# Patient Record
Sex: Male | Born: 1945 | Race: White | Hispanic: No | Marital: Single | Smoking: Never smoker
Health system: Southern US, Community
[De-identification: ages and names within clinical notes are randomized; demographics above are authoritative.]

## PROBLEM LIST (undated history)

## (undated) DIAGNOSIS — I10 Essential (primary) hypertension: Secondary | ICD-10-CM

## (undated) DIAGNOSIS — I2510 Atherosclerotic heart disease of native coronary artery without angina pectoris: Secondary | ICD-10-CM

## (undated) DIAGNOSIS — E785 Hyperlipidemia, unspecified: Secondary | ICD-10-CM

## (undated) DIAGNOSIS — R55 Syncope and collapse: Secondary | ICD-10-CM

## (undated) DIAGNOSIS — D0339 Melanoma in situ of other parts of face: Secondary | ICD-10-CM

## (undated) DIAGNOSIS — E039 Hypothyroidism, unspecified: Secondary | ICD-10-CM

## (undated) HISTORY — DX: Atherosclerotic heart disease of native coronary artery without angina pectoris: I25.10

## (undated) HISTORY — PX: MITRAL VALVE REPAIR: SHX2039

## (undated) HISTORY — DX: Melanoma in situ of other parts of face: D03.39

## (undated) HISTORY — DX: Essential (primary) hypertension: I10

## (undated) HISTORY — PX: SKIN CANCER EXCISION: SHX779

## (undated) HISTORY — PX: LAPAROSCOPIC APPENDECTOMY: SUR753

## (undated) HISTORY — DX: Syncope and collapse: R55

## (undated) HISTORY — PX: HERNIA REPAIR: SHX51

---

## 1994-12-17 HISTORY — PX: CORONARY ARTERY BYPASS GRAFT: SHX141

## 1998-06-24 ENCOUNTER — Inpatient Hospital Stay (HOSPITAL_COMMUNITY): Admission: EM | Admit: 1998-06-24 | Discharge: 1998-07-02 | Payer: Self-pay | Admitting: Emergency Medicine

## 1999-03-12 ENCOUNTER — Emergency Department (HOSPITAL_COMMUNITY): Admission: EM | Admit: 1999-03-12 | Discharge: 1999-03-12 | Payer: Self-pay | Admitting: Emergency Medicine

## 1999-07-12 ENCOUNTER — Ambulatory Visit (HOSPITAL_BASED_OUTPATIENT_CLINIC_OR_DEPARTMENT_OTHER): Admission: RE | Admit: 1999-07-12 | Discharge: 1999-07-12 | Payer: Self-pay | Admitting: Orthopedic Surgery

## 2003-01-05 ENCOUNTER — Encounter: Payer: Self-pay | Admitting: *Deleted

## 2003-01-05 ENCOUNTER — Encounter (HOSPITAL_COMMUNITY): Admission: RE | Admit: 2003-01-05 | Discharge: 2003-02-04 | Payer: Self-pay | Admitting: *Deleted

## 2005-02-08 ENCOUNTER — Ambulatory Visit: Payer: Self-pay | Admitting: Cardiology

## 2005-02-27 ENCOUNTER — Ambulatory Visit: Payer: Self-pay

## 2005-03-02 ENCOUNTER — Ambulatory Visit (HOSPITAL_COMMUNITY): Admission: RE | Admit: 2005-03-02 | Discharge: 2005-03-02 | Payer: Self-pay | Admitting: Gastroenterology

## 2005-08-17 DIAGNOSIS — D0339 Melanoma in situ of other parts of face: Secondary | ICD-10-CM

## 2005-08-17 HISTORY — DX: Melanoma in situ of other parts of face: D03.39

## 2006-03-28 ENCOUNTER — Ambulatory Visit: Payer: Self-pay | Admitting: Cardiology

## 2007-04-01 ENCOUNTER — Ambulatory Visit: Payer: Self-pay | Admitting: Cardiology

## 2007-04-10 ENCOUNTER — Ambulatory Visit: Payer: Self-pay

## 2007-04-10 LAB — CONVERTED CEMR LAB
ALT: 21 units/L (ref 0–40)
AST: 24 units/L (ref 0–37)
Albumin: 3.9 g/dL (ref 3.5–5.2)
Alkaline Phosphatase: 53 units/L (ref 39–117)
Bilirubin, Direct: 0.1 mg/dL (ref 0.0–0.3)
Cholesterol: 155 mg/dL (ref 0–200)
Direct LDL: 91.3 mg/dL
HDL: 42.6 mg/dL (ref >39.0)
LDL Cholesterol: 90 mg/dL (ref 0–99)
Total Bilirubin: 0.8 mg/dL (ref 0.3–1.2)
Total CHOL/HDL Ratio: 3.6
Total Protein: 6.8 g/dL (ref 6.0–8.3)
Triglycerides: 113 mg/dL (ref 0–149)
VLDL: 23 mg/dL (ref 0–40)

## 2007-05-02 ENCOUNTER — Ambulatory Visit: Payer: Self-pay | Admitting: Cardiology

## 2007-05-02 LAB — CONVERTED CEMR LAB
ALT: 22 units/L (ref 0–40)
AST: 25 units/L (ref 0–37)
Albumin: 3.7 g/dL (ref 3.5–5.2)
Alkaline Phosphatase: 52 units/L (ref 39–117)
Bilirubin, Direct: 0.1 mg/dL (ref 0.0–0.3)
Cholesterol: 148 mg/dL (ref 0–200)
HDL: 41 mg/dL (ref >39.0)
LDL Cholesterol: 89 mg/dL (ref 0–99)
Total Bilirubin: 0.8 mg/dL (ref 0.3–1.2)
Total CHOL/HDL Ratio: 3.6
Total Protein: 6.5 g/dL (ref 6.0–8.3)
Triglycerides: 89 mg/dL (ref 0–149)
VLDL: 18 mg/dL (ref 0–40)

## 2008-05-21 ENCOUNTER — Ambulatory Visit: Payer: Self-pay | Admitting: Cardiology

## 2008-05-31 ENCOUNTER — Ambulatory Visit: Payer: Self-pay

## 2008-11-24 ENCOUNTER — Ambulatory Visit: Payer: Self-pay | Admitting: Cardiology

## 2009-04-04 DIAGNOSIS — C4491 Basal cell carcinoma of skin, unspecified: Secondary | ICD-10-CM

## 2009-04-04 HISTORY — DX: Basal cell carcinoma of skin, unspecified: C44.91

## 2009-04-28 ENCOUNTER — Encounter: Payer: Self-pay | Admitting: Cardiology

## 2009-07-27 DIAGNOSIS — R55 Syncope and collapse: Secondary | ICD-10-CM | POA: Insufficient documentation

## 2009-07-27 DIAGNOSIS — I2510 Atherosclerotic heart disease of native coronary artery without angina pectoris: Secondary | ICD-10-CM | POA: Insufficient documentation

## 2009-07-28 ENCOUNTER — Ambulatory Visit: Payer: Self-pay | Admitting: Cardiology

## 2010-06-20 ENCOUNTER — Inpatient Hospital Stay (HOSPITAL_COMMUNITY): Admission: EM | Admit: 2010-06-20 | Discharge: 2010-06-21 | Payer: Self-pay | Admitting: Emergency Medicine

## 2010-06-20 ENCOUNTER — Encounter (INDEPENDENT_AMBULATORY_CARE_PROVIDER_SITE_OTHER): Payer: Self-pay | Admitting: Surgery

## 2010-08-07 ENCOUNTER — Ambulatory Visit: Payer: Self-pay | Admitting: Cardiology

## 2010-08-07 DIAGNOSIS — I4510 Unspecified right bundle-branch block: Secondary | ICD-10-CM | POA: Insufficient documentation

## 2010-08-07 DIAGNOSIS — I2510 Atherosclerotic heart disease of native coronary artery without angina pectoris: Secondary | ICD-10-CM | POA: Insufficient documentation

## 2010-08-07 DIAGNOSIS — E663 Overweight: Secondary | ICD-10-CM | POA: Insufficient documentation

## 2010-08-07 DIAGNOSIS — R0602 Shortness of breath: Secondary | ICD-10-CM | POA: Insufficient documentation

## 2011-01-18 NOTE — Letter (Signed)
Summary: [place] [facility] (Nuc Med Stress)  [facility]  [address] [place] [address]   Phone: [phone]  Fax: [phone]    Nuclear Medicine 1-Day Stress Test Information Sheet  Re:     TYLOR COURTWRIGHT   DOB:     [birth year] MRN:     [id number] Weight:  Appointment Date: Register at: Appointment Time: Referring MD:  _X__Exercise Stress  __Adenosine   __Dobutamine  __Lexiscan  __Persantine   __Thallium  Urgency: ____1 (next day)   ____2 (one week)    ____3 (PRN)  Patient will receive Follow Up call with results: Patient needs follow-up appointment:  Instructions regarding medication:  How to prepare for your stress test: 1. DO NOT eat or dring 6 hours prior to your arrival time. This includes no caffeine (coffee, tea, sodas, chocolate) if you were instructed to take your medications, drink water with it. 2. DO NOT use any tobacco products for at leaset 8 hours prior to arrival. 3. DO NOT wear dresses or any clothing that may have metal clasps or buttons. 4. Wear short sleeve shirts, loose clothing, and comfortalbe walking shoes. 5. DO NOT use lotions, oils or powder on your chest before the test. 6. The test will take approximately 3-4 hours from the time you arrive until completion. 7. To register the day of the test, go to the Short Stay entrance at [facility]. 8. If you must cancel your test, call [phone] as soon as you are aware. 9. PLEASE DO NOT TAKE TOPROL (METOPROLOL) THE MORNING OF TEST. After you arrive for test:   When you arrive at [facility], you will go to Short Stay to be registered. They will then send you to Radiology to check in. The Nuclear Medicine Tech will get you and start an IV in your arm or hand. A small amount of a radioactive tracer will then be injected into your IV. This tracer will then have to circulate for 30-45 minutes. During this time you will wait in the waiting room and you will be able to drink  something without caffeine. A series of pictures will be taken of your heart follwoing this waiting period. After the 1st set of pictures you will go to the stress lab to get ready for your stress test. During the stress test, another small amount of a radioactive tracer will be injected through your IV. When the stress test is complete, there is a short rest period while your heart rate and blood pressure will be monitored. When this monitoring period is complete you will have another set of pictrues taken. (The same as the 1st set of pictures). These pictures are taken between 15 minutes and 1 hour after the stress test. The time depends on the type of stress test you had. Your doctor will inform you of your test results within 7 days after test.    The possibilities of certain changes are possible during the test. They include abnormal blood pressure and disorders of the heart. Side effects of persantine or adenosine can include flushing, chest pain, shortness of breath, stomach tightness, headache and light-headedness. These side effects usually do not last long and are self-resolving. Every effort will be made to keep you comfortable and to minimize complications by obtaining a medical history and by close observation during the test. Emergency equipment, medications, and trained personnel are available to deal with any unusual situation which may arise.  Please notify office at least  48 hours in advance if you are unable to keep this appt.

## 2011-01-18 NOTE — Assessment & Plan Note (Signed)
Summary: Matthew Williams   Visit Type:  Follow-up Primary Provider:  Christene Slates  CC:  no cardiology complaints.  History of Present Illness: Matthew Williams returns today for evaluation and management his coronary disease.  He's having no angina but does have dyspnea on exertion. His weight is up about 15 pounds.  His last stress Myoview was in June of 2009. EF is 67% with no ischemia.  Since his last visit, he had had an emergency appendectomy. There were no complications. I saw him at that time.  I reviewed his labs today from June. Unfortunately, with his weight gain, his numbers look worse. All maximum Crestor his LDL is 99.   Current Medications (verified): 1)  Diovan 80 Mg Tabs (Valsartan) 2)  Synthroid 50 Mcg Tabs (Levothyroxine Sodium) .... Take 1 Tab Daily 3)  Zyrtec Allergy 10 Mg Tabs (Cetirizine Hcl) .... Take 1 Tab Daily 4)  Toprol Xl 50 Mg Xr24h-Tab (Metoprolol Succinate) .... Take 1 Tab Daily 5)  Aspirin 325 Mg Tabs (Aspirin) .... Take 1 Tab Daily 6)  Alprazolam 0.5 Mg Tabs (Alprazolam) .... Take As Needed 7)  Crestor 40 Mg Tabs (Rosuvastatin Calcium) .... Take 1 Tab Daily 8)  Magnesium Oxide 400 Mg Tabs (Magnesium Oxide) .... Take 1 Tab Daily 9)  Nitrostat 0.4 Mg Subl (Nitroglycerin) .... Take As Directed  Allergies (verified): No Known Drug Allergies  Past History:  Past Medical History: Last updated: 07/27/2009 Current Problems:  SYNCOPE (ICD-780.2) CAD (ICD-414.00)  Past Surgical History: Last updated: 07/27/2009 cabg 1996 mitral valve replacement  Family History: Last updated: 07/27/2009 Father:unknown Mother:unknown  Social History: Last updated: 07/27/2009 Tobacco Use - No.  Alcohol Use - no Regular Exercise - no Drug Use - no  Risk Factors: Exercise: no (07/27/2009)  Risk Factors: Smoking Status: never (07/27/2009)  Review of Systems       negative other than history of present illness  Vital Signs:  Patient profile:   65 year old  male Weight:      212 pounds BMI:     30.53 Pulse rate:   60 / minute BP sitting:   116 / 75  (right arm)  Vitals Entered By: Dreama Saa, CNA (August 07, 2010 9:19 AM)  Physical Exam  General:  no acute distress, he is clearly gained weight Head:  normocephalic and atraumatic Eyes:  PERRLA/EOM intact; conjunctiva and lids normal. Neck:  Neck supple, no JVD. No masses, thyromegaly or abnormal cervical nodes. Chest Wall:  no deformities or breast masses noted Lungs:  Clear bilaterally to auscultation and percussion. Heart:  PMI nondisplaced, normal S1-S2, regular rate and rhythm, carotids normal without bruits Abdomen:  Bowel sounds positive; abdomen soft and non-tender without masses, organomegaly, or hernias noted. No hepatosplenomegaly. Msk:  Back normal, normal gait. Muscle strength and tone normal. Pulses:  pulses normal in all 4 extremities Extremities:  No clubbing or cyanosis. Neurologic:  Alert and oriented x 3. Skin:  Intact without lesions or rashes. Psych:  Normal affect.   Problems:  Medical Problems Added: 1)  Dx of Rbbb  (ICD-426.4) 2)  Dx of Overweight/obesity  (ICD-278.02) 3)  Dx of Coronary Atherosclerosis of Artery Bypass Graft  (ICD-414.04) 4)  Dx of Dyspnea  (ICD-786.05)  EKG  Procedure date:  08/07/2010  Findings:      normal sinus rhythm, right bundle branch block, no change  Impression & Recommendations:  Problem # 1:  CORONARY ATHEROSCLEROSIS OF ARTERY BYPASS GRAFT (ICD-414.04) His dyspnea may be related to weight gain but  I'm concerned about an ischemic equivalent. I'll arrange an exercise stress Myoview off of Toprol.  His updated medication list for this problem includes:    Toprol Xl 50 Mg Xr24h-tab (Metoprolol succinate) .Marland Kitchen... Take 1 tab daily    Aspirin 325 Mg Tabs (Aspirin) .Marland Kitchen... Take 1 tab daily    Nitrostat 0.4 Mg Subl (Nitroglycerin) .Marland Kitchen... Take as directed  Problem # 2:  DYSPNEA (ICD-786.05) Assessment: Deteriorated  His  updated medication list for this problem includes:    Diovan 80 Mg Tabs (Valsartan)    Toprol Xl 50 Mg Xr24h-tab (Metoprolol succinate) .Marland Kitchen... Take 1 tab daily    Aspirin 325 Mg Tabs (Aspirin) .Marland Kitchen... Take 1 tab daily  Orders: Nuclear Stress Test (Nuc Stress Test)  Problem # 3:  OVERWEIGHT/OBESITY (ICD-278.02) His weight is and issue I advised him to lose 15 pounds.  Problem # 4:  RBBB (ICD-426.4) Assessment: Unchanged  His updated medication list for this problem includes:    Toprol Xl 50 Mg Xr24h-tab (Metoprolol succinate) .Marland Kitchen... Take 1 tab daily    Aspirin 325 Mg Tabs (Aspirin) .Marland Kitchen... Take 1 tab daily    Nitrostat 0.4 Mg Subl (Nitroglycerin) .Marland Kitchen... Take as directed  Patient Instructions: 1)  Your physician recommends that you schedule a follow-up appointment in: 12 months 2)  Your physician recommends that you continue on your current medications as directed. Please refer to the Current Medication list given to you today. 3)  Your physician has requested that you have an exercise stress myoview.  For further information please visit [email or web address]  Please follow instruction sheet, as given.

## 2011-03-04 LAB — COMPREHENSIVE METABOLIC PANEL
AST: 25 U/L (ref 0–37)
BUN: 11 mg/dL (ref 6–23)
CO2: 28 mEq/L (ref 19–32)
Calcium: 8.9 mg/dL (ref 8.4–10.5)
Chloride: 104 mEq/L (ref 96–112)
Creatinine, Ser: 1.03 mg/dL (ref 0.4–1.5)
GFR calc Af Amer: >60 mL/min (ref >60)
GFR calc non Af Amer: >60 mL/min (ref >60)
Glucose, Bld: 104 mg/dL — ABNORMAL HIGH (ref 70–99)
Total Bilirubin: 0.9 mg/dL (ref 0.3–1.2)

## 2011-03-04 LAB — DIFFERENTIAL
Basophils Absolute: 0 10*3/uL (ref 0.0–0.1)
Eosinophils Relative: 0 % (ref 0–5)
Lymphocytes Relative: 12 % (ref 12–46)
Neutrophils Relative %: 79 % — ABNORMAL HIGH (ref 43–77)

## 2011-03-04 LAB — URINALYSIS, ROUTINE W REFLEX MICROSCOPIC
Bilirubin Urine: NEGATIVE
Glucose, UA: NEGATIVE mg/dL
Ketones, ur: NEGATIVE mg/dL
Leukocytes, UA: NEGATIVE
Nitrite: NEGATIVE
Protein, ur: NEGATIVE mg/dL
Specific Gravity, Urine: 1.019 (ref 1.005–1.030)
Urobilinogen, UA: 0.2 mg/dL (ref 0.0–1.0)
pH: 6 (ref 5.0–8.0)

## 2011-03-04 LAB — CBC
HCT: 46.3 % (ref 39.0–52.0)
Hemoglobin: 15.6 g/dL (ref 13.0–17.0)
MCH: 30.4 pg (ref 26.0–34.0)
MCHC: 33.8 g/dL (ref 30.0–36.0)
MCV: 90.1 fL (ref 78.0–100.0)
RBC: 5.14 MIL/uL (ref 4.22–5.81)

## 2011-03-04 LAB — POCT CARDIAC MARKERS: CKMB, poc: 2 ng/mL (ref 1.0–8.0)

## 2011-03-04 LAB — LIPASE, BLOOD: Lipase: 25 U/L (ref 11–59)

## 2011-03-04 LAB — URINE MICROSCOPIC-ADD ON

## 2011-04-23 ENCOUNTER — Other Ambulatory Visit: Payer: Self-pay | Admitting: Family Medicine

## 2011-05-01 NOTE — Assessment & Plan Note (Signed)
[facility]                            CARDIOLOGY OFFICE NOTE   NAME:Lukas, INAKI VANTINE                    MRN:          [id number]  DATE:05/21/2008                            DOB:          1946-08-24    [name] comes in today for followup.  He has been having increased dyspnea  on exertion.  He also has excess fatigue.   PAST MEDICAL HISTORY:  1. He has known coronary disease, and his coronary bypass grafting was      done in 1996.  His last cath was in 1999 which showed patent      grafts.  He had a stress Myoview on April 10, 2007, which showed      minimal scarring of the inferior wall, EF of 61%, and no      significant ischemia.  2. Hypertension.  3. Hyperlipidemia.  His lipids were recently checked by Dr. Doristine Counter      which I have reviewed today.  He is at goal.  4. He also has a history of syncope with a negative EP study in the      past.   He denies any orthopnea, PND or peripheral edema.  He has had no angina  per se.   CURRENT MEDICATIONS:  He is currently on:  1  Diovan 80 mg a day.  1. Synthroid 50 mcg daily.  2. Zyrtec 10 mg a day.  3. Toprol XL 50 mg a day.  4. Aspirin 325 mg a day.  5. Alprazolam 0.5 mg a day.  6. Crestor 40 mg a day.  7. Magnesium oxide 400 mg a day.   PHYSICAL EXAMINATION:  VITAL SIGNS:  Blood pressure 125/74, and his  pulse is 72 and regular.  His weight is 207, up 8.  HEENT:  Unchanged.  Carotid upstrokes were equal bilateral without  bruits.  No JVD.  Thyroid is not enlarged.  Trachea is midline.  LUNGS:  Clear.  HEART:  Reveals a nondisplaced PMI.  Normal S1, S2.  ABDOMEN:  Soft, good bowel sounds.  No midline bruit.  No hepatomegaly.  EXTREMITIES:  No cyanosis, clubbing or edema.  Pulses are intact.  NEURO:  Intact.   EKG shows sinus rhythm with a right bundle which is stable.   I am concerned about Mike's dyspnea on exertion.  He is also having a  lot of fatigue.  His labs were recently normal  including a CBC and  thyroid.   PLAN:  Exercise rest stress Myoview off of Toprol.  His grafts are 65  years old, and we need to rule out obstructive disease. If this is  negative, I will see him back in 6 months.     Thomas C. Daleen Squibb, MD, [facility]  Electronically Signed    TCW/MedQ  DD: 05/21/2008  DT: 05/21/2008  Job #: [id number]   cc:   Marjory Lies, M.D.

## 2011-05-01 NOTE — Assessment & Plan Note (Signed)
[facility]                            CARDIOLOGY OFFICE NOTE   NAME:Arterburn, OSAMAH SCHMADER                    MRN:          [id number]  DATE:11/24/2008                            DOB:          04-Oct-1946    [name] comes in today for followup.  Other than gaining some weight, he  has no complaints.   He just got back from [place], quill hunting.  He is going to [place] this  winter.  He was bracing low temperatures and high winds and had no chest  pain or angina.  I think it is an adequate stress test.   He had lipids back in the summer and was at goal.   His last stress Myoview was on May 31, 2008.  It showed excellent  exercise tolerance, EF 67% with no significant ischemia.   His meds include:  1. Diovan 80 mg per day.  2. Synthroid 50 mcg a day.  3. Zyrtec 10 mg a day.  4. Toprol-XL 50 mg per day.  5. Aspirin 325 mg a day.  6. Alprazolam 0.5 mg per day.  7. Crestor 40 mg a day.  8. Magnesium 400 mg a day.  9. Carries sublingual nitroglycerin.   PHYSICAL EXAMINATION:  VITAL SIGNS:  His blood pressure is 128/76, pulse  72 and regular.  His weight is up to 212.  HEENT:  Normal.  NECK:  Supple.  Carotids are equal bilaterally without bruits.  No JVD.  Thyroid is not enlarged.  Trachea is midline.  LUNGS:  Clear to auscultation and percussion.  HEART:  Soft S1 and S2.  PMI is hard to appreciate.  ABDOMEN:  Soft.  Good bowel sounds.  No midline bruit.  No pulsatile  mass.  EXTREMITIES:  There were no cyanosis or clubbing.  Trace edema.  Pulses  are intact.   ASSESSMENT AND PLAN:  [name] is doing well except he needs to drop his  weight.  He has had a little bit of residual edema at the end of a long  day.  I think this will get better as he drops  his weight and increases his activity.  We will see him back in 6  months.  I have made no changes in his medical program.     Jesse Sans. Daleen Squibb, MD, [facility]  Electronically Signed    TCW/MedQ  DD:  11/24/2008  DT: 11/24/2008  Job #: [id number]   cc:   Marjory Lies, M.D.

## 2011-05-04 NOTE — Procedures (Signed)
   NAME:  Matthew Williams, Matthew Williams NO.:  [id number]   MEDICAL RECORD NO.:  [id number]                  PATIENT TYPE:   LOCATION:                                       FACILITY:   PHYSICIAN:  Vida Roller, M.D.                DATE OF BIRTH:   DATE OF PROCEDURE:  01/05/2003  DATE OF DISCHARGE:                                  ECHOCARDIOGRAM   NUCLEAR PERFUSION IMAGING STUDY:   REFERRING PHYSICIANS:  [place] Bing, M.D. [facility] and Jesse Sans. Wall, M.D.  [facility]   INDICATIONS FOR PROCEDURE:  This is a patient with coronary artery disease,  status post coronary artery bypass graft with patent grafts and cardiac  catheterization in 1999 now with angina.   DESCRIPTION OF PROCEDURE:  Stress used was exercise.  The patient exercised  for approximately 7 minutes and 30 seconds of a Bruce protocol attaining 10  METS of exercise.  His baseline heart rate was 67, maximum achieved heart  rate was 152 which is 93% of his maximum-predicted heart rate for age.  His  maximum systolic blood pressure was 220, giving him a double product of  2700.  He sustained mild shortness of breath which resolved in recovery.  He  had only a few PACs with no obvious ischemic changes on electrocardiogram;  and the reason for terminating the exam was fatigue  His baseline  electrocardiogram is interpretable with only occasional PACs and shows no  ischemic changes.   The patient was imaged in the Spec mode.  The reconstruction was in the  short axis, horizontal long axis, and vertical long axis.  The patient was  imaged with technetium Cardiolite 10 millicuries at rest and 30 millicuries  for stress (he weighs 190 pounds and he is 6 feet tall).  Interpretation of  the images revealed only minimal motion artifact on the raw images.  Spec  imaging shows minimal apical thinning not consistent with either ischemia,  may indeed be an old infarction.  His gaited images show an ejection  fraction of  63% with no regional wall abnormalities.   IMPRESSION:  This is a diagnostic exercise perfusion imaging with only  minimal evidence of a myocardial infarction in the distal left anterior  descending distribution, but no active ischemia with preserved left  ventricular ejection fraction.                                               Vida Roller, M.D.    JH/MEDQ  D:  01/05/2003  T:  01/06/2003  Job:  [id number]

## 2011-05-04 NOTE — Procedures (Signed)
   NAME:  Matthew Williams, Matthew Williams NO.:  [id number]   MEDICAL RECORD NO.:  [id number]                    PATIENT TYPE:   LOCATION:                                       FACILITY:   PHYSICIAN:  Vida Roller, M.D.                DATE OF BIRTH:  [birth year]   DATE OF PROCEDURE:  DATE OF DISCHARGE:                                    STRESS TEST   EXERCISE CARDIOLITE:   INDICATION:  The patient has a history of coronary artery disease status  post coronary artery bypass grafting with patent grafts by catheterization  in July 1999.  He has had no recurrent anginal or ischemic symptoms.   BASELINE DATA:  EKG normal sinus rhythm at 67 beats per minute with  nonspecific ST changes.  Baseline blood pressure 120/80.   The patient exercised for 7 minutes and 30 seconds to 10.1 METS in a Bruce  protocol stage III.  Maximum heart rate achieved was 152 beats per minute.  Maximum blood pressure achieved was 220/98.  EKG showed a few PACs with no  ischemic changes noted.  The patient complained of shortness of breath which  resolved in recovery.   Final images and results are pending MD review.     Amy Mercy Riding, P.A. [facility]                     Vida Roller, M.D.    AB/MEDQ  D:  01/05/2003  T:  01/06/2003  Job:  [id number]

## 2011-05-04 NOTE — Assessment & Plan Note (Signed)
[facility]                            CARDIOLOGY OFFICE NOTE   NAME:Matthew Williams, Matthew Williams                    MRN:          [id number]  DATE:04/01/2007                            DOB:          08-Jul-1946    Matthew Williams comes in today for further management of his coronary artery  disease.  He is status post coronary artery bypass grafting in 1999.  He  is due a stress Myoview.  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  He has had no angina.  He also has a history  of hypertension, hyperlipidemia, and a history of unexplained syncope  with a negative EP study in the past.   Because of the Vytorin controversy, Dr. Doristine Counter switched him over to  Crestor 20 mg a day.  His lipids on Vytorin 10/40 were at goal.   He denies any problems at the present time.   MEDICATIONS:  1. Diovan 80 mg a day.  2. Synthroid 60 mcg a day.  3. Zyrtec 10 mg a day.  4. Toprol XL 50 mg a day.  5. Aspirin 325 a day.  6. Crestor 20 mg a day.  7. Alprazolam 0.5 mg daily.  8. Lodrane 24 ER daily.   PHYSICAL EXAM:  Blood pressure is 120/79, pulse was 68 and regular.  He  has normal sinus rhythm with a right bundle, which is stable.  Weight is  199.  He is in no acute distress.  HEENT:  Normocephalic, atraumatic.  PERRLA.  Extraocular muscles are  intact.  Sclerae clear.  Facial symmetry is normal.  Dentition is  satisfactory.  Carotids are full.  There are no bruits.  Thyroid is not enlarged.  Trachea is midline.  There is no JVD.  LUNGS:  Clear.  HEART:  Reveals a nondisplaced PMI.  There is a normal S1, S2.  Widely  split S2.  ABDOMEN:  Soft with good bowel sounds.  No midline bruit.  No  hepatomegaly.  EXTREMITIES:  With no cyanosis, clubbing, or edema.  Pulses are brisk.  NEURO:  Exam is intact.   ASSESSMENT AND PLAN:  Matthew Williams is doing well.  He is due a stress Myoview,  which we will arrange off of Toprol.  In addition, he would like to  follow up  his blood pressure on Crestor,  which he has not done yet.  I will send a copy to Dr. Doristine Counter.  I will  plan on seeing him back, if his stress test is unremarkable, in a year.     Thomas C. Daleen Squibb, MD, [facility]  Electronically Signed    TCW/MedQ  DD: 04/01/2007  DT: 04/01/2007  Job #: [id number]   cc:   Marjory Lies, M.D.

## 2011-06-28 ENCOUNTER — Telehealth: Payer: Self-pay | Admitting: Cardiology

## 2011-06-28 MED ORDER — METOPROLOL SUCCINATE ER 50 MG PO TB24: 50.0000 mg | ORAL_TABLET | Freq: Every day | ORAL | Status: DC

## 2011-06-28 NOTE — Telephone Encounter (Signed)
Patient needs refill on Toprol 50mg  QD sent to [facility] / tg

## 2011-07-23 ENCOUNTER — Encounter: Payer: Self-pay | Admitting: Cardiology

## 2011-08-06 ENCOUNTER — Encounter: Payer: Self-pay | Admitting: Cardiology

## 2011-08-06 ENCOUNTER — Ambulatory Visit (INDEPENDENT_AMBULATORY_CARE_PROVIDER_SITE_OTHER): Payer: Medicare Other | Admitting: Cardiology

## 2011-08-06 DIAGNOSIS — R55 Syncope and collapse: Secondary | ICD-10-CM

## 2011-08-06 DIAGNOSIS — I2510 Atherosclerotic heart disease of native coronary artery without angina pectoris: Secondary | ICD-10-CM

## 2011-08-06 DIAGNOSIS — I4510 Unspecified right bundle-branch block: Secondary | ICD-10-CM

## 2011-08-06 DIAGNOSIS — E782 Mixed hyperlipidemia: Secondary | ICD-10-CM

## 2011-08-06 DIAGNOSIS — I25810 Atherosclerosis of coronary artery bypass graft(s) without angina pectoris: Secondary | ICD-10-CM

## 2011-08-06 DIAGNOSIS — R0602 Shortness of breath: Secondary | ICD-10-CM

## 2011-08-06 NOTE — Patient Instructions (Signed)
Your physician recommends that you schedule a follow-up appointment in: 1 year  

## 2011-08-06 NOTE — Assessment & Plan Note (Signed)
Stable. No change in medical therapy. 

## 2011-08-06 NOTE — Progress Notes (Signed)
HPI Matthew Williams comes in today for follow up of this coronary disease and previous current bypass grafting.  He continues to walk on a regular basis and is having no angina. His weight is up and he is having trouble getting this off.  He's compliant with his medications.  Laboratory data from his primary care review. His total cholesterol was 144, triglycerides 131, and a she'll 39, LDL 78 with normal LFTs. Fasting blood sugar was 91, potassium was 5.6, creatinine 1.0.  His EKG shows normal sinus rhythm with right bundle. This is stable. Past Medical History  Diagnosis Date  . Syncope   . CAD (coronary artery disease)     Past Surgical History  Procedure Date  . Coronary artery bypass graft 1996  . Mitral valve replacement     No family history on file.  History   Social History  . Marital Status: Single    Spouse Name: N/A    Number of Children: N/A  . Years of Education: N/A   Occupational History  . Not on file.   Social History Main Topics  . Smoking status: Unknown If Ever Smoked  . Smokeless tobacco: Not on file  . Alcohol Use: No  . Drug Use: No  . Sexually Active: Not on file   Other Topics Concern  . Not on file   Social History Narrative   Does not get regular exercise    No Known Allergies  Current Outpatient Prescriptions  Medication Sig Dispense Refill  . ALPRAZolam (XANAX) 0.5 MG tablet Take 0.5 mg by mouth as needed.        Marland Kitchen aspirin 325 MG tablet Take 325 mg by mouth daily.        . cetirizine (ZYRTEC) 10 MG tablet Take 10 mg by mouth daily.        Marland Kitchen levothyroxine (SYNTHROID, LEVOTHROID) 50 MCG tablet Take 50 mcg by mouth daily.        . magnesium oxide (MAG-OX) 400 MG tablet Take 400 mg by mouth daily.        . metoprolol (TOPROL-XL) 50 MG 24 hr tablet Take 1 tablet (50 mg total) by mouth daily.  30 tablet  1  . nitroGLYCERIN (NITROSTAT) 0.4 MG SL tablet Place 0.4 mg under the tongue as directed.        . rosuvastatin (CRESTOR) 40 MG tablet Take 40  mg by mouth daily.        . valsartan (DIOVAN) 80 MG tablet Take 80 mg by mouth.          ROS Negative other than HPI.   PE General Appearance: well developed, well nourished in no acute distress, overweight HEENT: symmetrical face, PERRLA, good dentition  Neck: no JVD, thyromegaly, or adenopathy, trachea midline Chest: symmetric without deformity Cardiac: PMI non-displaced, RRR, normal S1, S2, no gallop or murmur Lung: clear to ausculation and percussion Vascular: all pulses full without bruits  Abdominal: nondistended, nontender, good bowel sounds, no HSM, no bruits Extremities: no cyanosis, clubbing or edema, no sign of DVT, no varicosities  Skin: normal color, no rashes Neuro: alert and oriented x 3, non-focal Pysch: normal affect Filed Vitals:   08/06/11 1338  BP: 125/72  Pulse: 62  Resp: 18  Height: 6' (1.829 m)  Weight: 218 lb 6.4 oz (99.066 kg)  SpO2: 98%    EKG  Labs and Studies Reviewed.   Lab Results  Component Value Date   WBC 14.5* 06/20/2010   HGB 15.6 06/20/2010  HCT 46.3 06/20/2010   MCV 90.1 06/20/2010   PLT 147* 06/20/2010      Chemistry      Component Value Date/Time   NA 138 06/20/2010 1603   K 3.9 06/20/2010 1603   CL 104 06/20/2010 1603   CO2 28 06/20/2010 1603   BUN 11 06/20/2010 1603   CREATININE 1.03 06/20/2010 1603      Component Value Date/Time   CALCIUM 8.9 06/20/2010 1603   ALKPHOS 61 06/20/2010 1603   AST 25 06/20/2010 1603   ALT 21 06/20/2010 1603   BILITOT 0.9 06/20/2010 1603       Lab Results  Component Value Date   CHOL 148 05/02/2007   CHOL 155 04/10/2007   Lab Results  Component Value Date   HDL 41.0 05/02/2007   HDL 42.6 04/10/2007   Lab Results  Component Value Date   LDLCALC 89 05/02/2007   LDLCALC 90 04/10/2007   Lab Results  Component Value Date   TRIG 89 05/02/2007   TRIG 113 04/10/2007   Lab Results  Component Value Date   CHOLHDL 3.6 CALC 05/02/2007   CHOLHDL 3.6 CALC 04/10/2007   No results found for this basename: HGBA1C    Lab Results  Component Value Date   ALT 21 06/20/2010   AST 25 06/20/2010   ALKPHOS 61 06/20/2010   BILITOT 0.9 06/20/2010   No results found for this basename: TSH

## 2011-08-06 NOTE — Assessment & Plan Note (Signed)
Stable. A change in treatment.

## 2011-09-05 ENCOUNTER — Other Ambulatory Visit: Payer: Self-pay | Admitting: Dermatology

## 2012-04-30 ENCOUNTER — Other Ambulatory Visit: Payer: Self-pay | Admitting: Dermatology

## 2012-08-05 ENCOUNTER — Encounter: Payer: Self-pay | Admitting: Cardiology

## 2012-08-05 ENCOUNTER — Ambulatory Visit (INDEPENDENT_AMBULATORY_CARE_PROVIDER_SITE_OTHER): Payer: Medicare Other | Admitting: Cardiology

## 2012-08-05 VITALS — BP 124/76 | HR 60 | Ht 71.0 in | Wt 213.8 lb

## 2012-08-05 DIAGNOSIS — I10 Essential (primary) hypertension: Secondary | ICD-10-CM | POA: Insufficient documentation

## 2012-08-05 DIAGNOSIS — I4510 Unspecified right bundle-branch block: Secondary | ICD-10-CM

## 2012-08-05 DIAGNOSIS — I2510 Atherosclerotic heart disease of native coronary artery without angina pectoris: Secondary | ICD-10-CM

## 2012-08-05 DIAGNOSIS — I25810 Atherosclerosis of coronary artery bypass graft(s) without angina pectoris: Secondary | ICD-10-CM

## 2012-08-05 DIAGNOSIS — E782 Mixed hyperlipidemia: Secondary | ICD-10-CM

## 2012-08-05 MED ORDER — NITROGLYCERIN 0.4 MG SL SUBL: 0.4000 mg | SUBLINGUAL_TABLET | SUBLINGUAL | Status: DC

## 2012-08-05 NOTE — Patient Instructions (Signed)
Your physician recommends that you schedule a follow-up appointment in: 1 year  

## 2012-08-05 NOTE — Assessment & Plan Note (Signed)
Doing well. Continue secondary preventative therapy. Nitroglycerin represcribed. See back in one year.

## 2012-08-05 NOTE — Progress Notes (Signed)
HPI Matthew Williams comes in today for evaluation and management his coronary artery disease and history of bypass surgery. He is having no angina or ischemic symptoms. He is getting ready to go to [place] to hunt.  Recent blood work shows his lipids to be a goal. Specifically his LDL was 70. His LDL slightly low at 38.  His blood pressures been under good control. He is only on losartan.  Past Medical History  Diagnosis Date  . Syncope   . CAD (coronary artery disease)     Current Outpatient Prescriptions  Medication Sig Dispense Refill  . ALPRAZolam (XANAX) 0.5 MG tablet Take 0.5 mg by mouth as needed.        Marland Kitchen aspirin 325 MG tablet Take 325 mg by mouth daily.        . cetirizine (ZYRTEC) 10 MG tablet Take 10 mg by mouth as needed.       Marland Kitchen levothyroxine (SYNTHROID, LEVOTHROID) 75 MCG tablet Take 75 mcg by mouth daily.      Marland Kitchen losartan (COZAAR) 50 MG tablet Take 50 mg by mouth daily.       . metoprolol succinate (TOPROL-XL) 50 MG 24 hr tablet Take 50 mg by mouth daily. Take with or immediately following a meal.      . nitroGLYCERIN (NITROSTAT) 0.4 MG SL tablet Place 1 tablet (0.4 mg total) under the tongue as directed.  25 tablet  3  . rosuvastatin (CRESTOR) 40 MG tablet Take 40 mg by mouth daily.        Marland Kitchen DISCONTD: nitroGLYCERIN (NITROSTAT) 0.4 MG SL tablet Place 0.4 mg under the tongue as directed.        . metoprolol (TOPROL-XL) 50 MG 24 hr tablet Take 1 tablet (50 mg total) by mouth daily.  30 tablet  1  . valsartan (DIOVAN) 80 MG tablet Take 80 mg by mouth.          No Known Allergies  No family history on file.  History   Social History  . Marital Status: Single    Spouse Name: N/A    Number of Children: N/A  . Years of Education: N/A   Occupational History  . Not on file.   Social History Main Topics  . Smoking status: Never Smoker   . Smokeless tobacco: Not on file  . Alcohol Use: No  . Drug Use: No  . Sexually Active: Not on file   Other Topics Concern  . Not on file     Social History Narrative   Does not get regular exercise    ROS ALL NEGATIVE EXCEPT THOSE NOTED IN HPI  PE  General Appearance: well developed, well nourished in no acute distress HEENT: symmetrical face, PERRLA, good dentition  Neck: no JVD, thyromegaly, or adenopathy, trachea midline Chest: symmetric without deformity Cardiac: PMI non-displaced, RRR, normal S1, S2, no gallop or murmur Lung: clear to ausculation and percussion Vascular: all pulses full without bruits  Abdominal: nondistended, nontender, good bowel sounds, no HSM, no bruits Extremities: no cyanosis, clubbing or edema, no sign of DVT, no varicosities  Skin: normal color, no rashes Neuro: alert and oriented x 3, non-focal Pysch: normal affect  EKG Normal sinus rhythm, right bundle branch block, no change BMET    Component Value Date/Time   NA 138 06/20/2010 1603   K 3.9 06/20/2010 1603   CL 104 06/20/2010 1603   CO2 28 06/20/2010 1603   GLUCOSE 104* 06/20/2010 1603   BUN 11 06/20/2010 1603  CREATININE 1.03 06/20/2010 1603   CALCIUM 8.9 06/20/2010 1603   GFRNONAA >60 06/20/2010 1603   GFRAA  Value: >60        The eGFR has been calculated using the MDRD equation. This calculation has not been validated in all clinical situations. eGFR's persistently <60 mL/min signify possible Chronic Kidney Disease. 06/20/2010 1603    Lipid Panel     Component Value Date/Time   CHOL 148 05/02/2007 0852   TRIG 89 05/02/2007 0852   HDL 41.0 05/02/2007 0852   CHOLHDL 3.6 CALC 05/02/2007 0852   VLDL 18 05/02/2007 0852   LDLCALC 89 05/02/2007 0852    CBC    Component Value Date/Time   WBC 14.5* 06/20/2010 1603   RBC 5.14 06/20/2010 1603   HGB 15.6 06/20/2010 1603   HCT 46.3 06/20/2010 1603   PLT 147* 06/20/2010 1603   MCV 90.1 06/20/2010 1603   MCH 30.4 06/20/2010 1603   MCHC 33.8 06/20/2010 1603   RDW 13.4 06/20/2010 1603   LYMPHSABS 1.7 06/20/2010 1603   MONOABS 1.3* 06/20/2010 1603   EOSABS 0.0 06/20/2010 1603   BASOSABS 0.0 06/20/2010 1603

## 2013-02-03 ENCOUNTER — Other Ambulatory Visit: Payer: Self-pay | Admitting: Dermatology

## 2013-02-03 DIAGNOSIS — C4492 Squamous cell carcinoma of skin, unspecified: Secondary | ICD-10-CM

## 2013-02-03 DIAGNOSIS — C4491 Basal cell carcinoma of skin, unspecified: Secondary | ICD-10-CM

## 2013-02-03 HISTORY — DX: Basal cell carcinoma of skin, unspecified: C44.91

## 2013-02-03 HISTORY — DX: Squamous cell carcinoma of skin, unspecified: C44.92

## 2013-08-03 ENCOUNTER — Encounter: Payer: Self-pay | Admitting: Cardiology

## 2013-08-03 ENCOUNTER — Ambulatory Visit: Payer: Medicare Other | Admitting: Cardiology

## 2013-08-03 ENCOUNTER — Ambulatory Visit (INDEPENDENT_AMBULATORY_CARE_PROVIDER_SITE_OTHER): Payer: Medicare Other | Admitting: Cardiology

## 2013-08-03 VITALS — BP 133/79 | HR 65 | Ht 71.0 in | Wt 218.0 lb

## 2013-08-03 DIAGNOSIS — I25810 Atherosclerosis of coronary artery bypass graft(s) without angina pectoris: Secondary | ICD-10-CM

## 2013-08-03 DIAGNOSIS — I4510 Unspecified right bundle-branch block: Secondary | ICD-10-CM

## 2013-08-03 DIAGNOSIS — E663 Overweight: Secondary | ICD-10-CM

## 2013-08-03 DIAGNOSIS — I10 Essential (primary) hypertension: Secondary | ICD-10-CM

## 2013-08-03 DIAGNOSIS — I2510 Atherosclerotic heart disease of native coronary artery without angina pectoris: Secondary | ICD-10-CM

## 2013-08-03 DIAGNOSIS — E782 Mixed hyperlipidemia: Secondary | ICD-10-CM

## 2013-08-03 NOTE — Assessment & Plan Note (Signed)
Stable. Continue secondary preventative therapy. 

## 2013-08-03 NOTE — Patient Instructions (Addendum)
Your physician recommends that you schedule a follow-up appointment in: ONE YEAR WITH DR MCDOWELL

## 2013-08-03 NOTE — Progress Notes (Signed)
HPI In today for evaluation and management his history of coronary artery disease, status post chronic bypass grafting in 2006. He has a history of hypertension hyperlipidemia. He brought labs today and his lipids are at goal.  He denies any angina or ischemic symptoms. He still remains very active on his phone and still [place]. He is going to [place] or [place] this fall.  He is compliant with his medications.  Past Medical History  Diagnosis Date  . Syncope   . CAD (coronary artery disease)     Current Outpatient Prescriptions  Medication Sig Dispense Refill  . ALPRAZolam (XANAX) 0.5 MG tablet Take 0.5 mg by mouth as needed.        Marland Kitchen aspirin 325 MG tablet Take 325 mg by mouth daily.        . cetirizine (ZYRTEC) 10 MG tablet Take 10 mg by mouth as needed.       Marland Kitchen levothyroxine (SYNTHROID, LEVOTHROID) 75 MCG tablet Take 75 mcg by mouth daily.      Marland Kitchen losartan (COZAAR) 50 MG tablet Take 50 mg by mouth daily.       . metoprolol succinate (TOPROL-XL) 50 MG 24 hr tablet Take 50 mg by mouth daily. Take with or immediately following a meal.      . nitroGLYCERIN (NITROSTAT) 0.4 MG SL tablet Place 1 tablet (0.4 mg total) under the tongue as directed.  25 tablet  3  . rosuvastatin (CRESTOR) 40 MG tablet Take 40 mg by mouth daily.        . valsartan (DIOVAN) 80 MG tablet Take 80 mg by mouth.        . metoprolol (TOPROL-XL) 50 MG 24 hr tablet Take 1 tablet (50 mg total) by mouth daily.  30 tablet  1   No current facility-administered medications for this visit.    No Known Allergies  History reviewed. No pertinent family history.  History   Social History  . Marital Status: Single    Spouse Name: N/A    Number of Children: N/A  . Years of Education: N/A   Occupational History  . Not on file.   Social History Main Topics  . Smoking status: Never Smoker   . Smokeless tobacco: Not on file  . Alcohol Use: No  . Drug Use: No  . Sexual Activity: Not on file   Other  Topics Concern  . Not on file   Social History Narrative   Does not get regular exercise    ROS ALL NEGATIVE EXCEPT THOSE NOTED IN HPI  PE  General Appearance: well developed, well nourished in no acute distress, overweight HEENT: symmetrical face, PERRLA, good dentition  Neck: no JVD, thyromegaly, or adenopathy, trachea midline Chest: symmetric without deformity Cardiac: PMI non-displaced, RRR, normal S1, S2, no gallop or murmur Lung: clear to ausculation and percussion Vascular: all pulses full without bruits  Abdominal: nondistended, nontender, good bowel sounds, no HSM, no bruits Extremities: no cyanosis, clubbing or edema, no sign of DVT, no varicosities  Skin: normal color, no rashes Neuro: alert and oriented x 3, non-focal Pysch: normal affect  EKG Normal sinus rhythm, right bundle branch block BMET    Component Value Date/Time   NA 138 06/20/2010 1603   K 3.9 06/20/2010 1603   CL 104 06/20/2010 1603   CO2 28 06/20/2010 1603   GLUCOSE 104* 06/20/2010 1603   BUN 11 06/20/2010 1603   CREATININE 1.03 06/20/2010 1603   CALCIUM 8.9 06/20/2010 1603  GFRNONAA >60 06/20/2010 1603   GFRAA  Value: >60        The eGFR has been calculated using the MDRD equation. This calculation has not been validated in all clinical situations. eGFR's persistently <60 mL/min signify possible Chronic Kidney Disease. 06/20/2010 1603    Lipid Panel     Component Value Date/Time   CHOL 148 05/02/2007 0852   TRIG 89 05/02/2007 0852   HDL 41.0 05/02/2007 0852   CHOLHDL 3.6 CALC 05/02/2007 0852   VLDL 18 05/02/2007 0852   LDLCALC 89 05/02/2007 0852    CBC    Component Value Date/Time   WBC 14.5* 06/20/2010 1603   RBC 5.14 06/20/2010 1603   HGB 15.6 06/20/2010 1603   HCT 46.3 06/20/2010 1603   PLT 147* 06/20/2010 1603   MCV 90.1 06/20/2010 1603   MCH 30.4 06/20/2010 1603   MCHC 33.8 06/20/2010 1603   RDW 13.4 06/20/2010 1603   LYMPHSABS 1.7 06/20/2010 1603   MONOABS 1.3* 06/20/2010 1603   EOSABS 0.0 06/20/2010 1603   BASOSABS  0.0 06/20/2010 1603

## 2013-08-25 ENCOUNTER — Other Ambulatory Visit: Payer: Self-pay | Admitting: Dermatology

## 2013-08-25 DIAGNOSIS — C4491 Basal cell carcinoma of skin, unspecified: Secondary | ICD-10-CM

## 2013-08-25 HISTORY — DX: Basal cell carcinoma of skin, unspecified: C44.91

## 2014-04-21 ENCOUNTER — Other Ambulatory Visit: Payer: Self-pay | Admitting: Dermatology

## 2014-08-19 ENCOUNTER — Ambulatory Visit (INDEPENDENT_AMBULATORY_CARE_PROVIDER_SITE_OTHER): Payer: Medicare Other | Admitting: Cardiology

## 2014-08-19 ENCOUNTER — Encounter: Payer: Self-pay | Admitting: Cardiology

## 2014-08-19 VITALS — BP 122/66 | HR 68 | Ht 71.0 in | Wt 214.0 lb

## 2014-08-19 DIAGNOSIS — E782 Mixed hyperlipidemia: Secondary | ICD-10-CM

## 2014-08-19 DIAGNOSIS — I2510 Atherosclerotic heart disease of native coronary artery without angina pectoris: Secondary | ICD-10-CM

## 2014-08-19 DIAGNOSIS — I059 Rheumatic mitral valve disease, unspecified: Secondary | ICD-10-CM

## 2014-08-19 DIAGNOSIS — I10 Essential (primary) hypertension: Secondary | ICD-10-CM

## 2014-08-19 NOTE — Progress Notes (Signed)
Clinical Summary [name] is a 68 y.o.male presenting to the office for followup, a former patient of Dr. Verl Blalock last seen in August 2014. This is our first meeting. He states that he has been doing very well, walks 3 miles at a time for exercise. Also enjoys hunting, will be going to [place] with a group of friends later this month for 3 weeks.  I reviewed his cardiac history. There is no detailed information in EPIC regarding coronary anatomy, bypass graft anatomy, or reported mitral valve surgery. Patient states that his operation was done by Dr. Redmond Pulling. It sounds like he may have had a mitral ring annuloplasty. It has been several years since he had a stress test or echocardiogram.  ECG today shows sinus rhythm with right bundle branch block, possible old inferolateral infarct pattern.  Recent lab work from August review finding BUN 14, creatinine 1.0, potassium 4.2, normal LFTs, TSH 0.51, hemoglobin 14.5, platelets 163, cholesterol 125, triglycerides 117, HDL 36, LDL 66.    No Known Allergies  Current Outpatient Prescriptions  Medication Sig Dispense Refill  . ALPRAZolam (XANAX) 0.5 MG tablet Take 0.5 mg by mouth as needed.        Marland Kitchen aspirin 325 MG tablet Take 325 mg by mouth daily.        Marland Kitchen levothyroxine (SYNTHROID, LEVOTHROID) 75 MCG tablet Take 75 mcg by mouth daily.      . metoprolol succinate (TOPROL-XL) 50 MG 24 hr tablet Take 50 mg by mouth daily. Take with or immediately following a meal.      . nitroGLYCERIN (NITROSTAT) 0.4 MG SL tablet Place 1 tablet (0.4 mg total) under the tongue as directed.  25 tablet  3  . rosuvastatin (CRESTOR) 40 MG tablet Take 40 mg by mouth daily.        . valsartan (DIOVAN) 80 MG tablet Take 80 mg by mouth. Pt not taking this everyday only when bp is high      . metoprolol (TOPROL-XL) 50 MG 24 hr tablet Take 1 tablet (50 mg total) by mouth daily.  30 tablet  1   No current facility-administered medications for this visit.    Past Medical  History  Diagnosis Date  . Syncope   . Coronary atherosclerosis of native coronary artery     Multivessel status post CABG  . Essential hypertension, benign     Past Surgical History  Procedure Laterality Date  . Coronary artery bypass graft  1996  . Mitral valve replacement    . Laparoscopic appendectomy      History reviewed. No pertinent family history.  Social History [name] reports that he has never smoked. He has never used smokeless tobacco. [name] reports that he does not drink alcohol.  Review of Systems No palpitations, dizziness, syncope, claudication, bleeding problems. Other systems reviewed and negative.  Physical Examination Filed Vitals:   08/19/14 0932  BP: 122/66  Pulse: 68   Filed Weights   08/19/14 0932  Weight: 214 lb (97.07 kg)   Patient appears comfortable at rest. HEENT: Conjunctiva and lids normal, oropharynx clear. Neck: Supple, no elevated JVP or carotid bruits, no thyromegaly. Lungs: Clear to auscultation, nonlabored breathing at rest. Cardiac: Regular rate and rhythm, no S3 or significant systolic murmur, no pericardial rub. Abdomen: Soft, nontender, bowel sounds present, no guarding or rebound. Extremities: No pitting edema, distal pulses 2+. Skin: Warm and dry. Musculoskeletal: No kyphosis. Neuropsychiatric: Alert and oriented x3, affect grossly appropriate.   Problem List  and Plan   Coronary atherosclerosis of native coronary artery Symptomatically stable on medical therapy, ECG reviewed. He walks regularly, 3 miles at a time without angina symptoms. No clear indication for stress testing other than duration since last evaluation. Will continue observation for now. Echocardiogram will be obtained to reassess LVEF and valvular status.  Mitral valve disease I do not have detailed information. It sounds like he may have had mitral regurgitation and underwent an annular ring repair. Echocardiogram will be obtained.  Essential  hypertension, benign Blood pressure is normal today.  Mixed hyperlipidemia Recent LDL 66, on Crestor.    Satira Sark, M.D., F.A.C.C.

## 2014-08-19 NOTE — Patient Instructions (Addendum)
Your physician wants you to follow-up in: 1 year You will receive a reminder letter in the mail two months in advance. If you don't receive a letter, please call our office to schedule the follow-up appointment.    Your physician recommends that you continue on your current medications as directed. Please refer to the Current Medication list given to you today.     Your physician has requested that you have an echocardiogram. Echocardiography is a painless test that uses sound waves to create images of your heart. It provides your doctor with information about the size and shape of your heart and how well your heart's chambers and valves are working. This procedure takes approximately one hour. There are no restrictions for this procedure.    We will add Dr.Kip Corrington as your pcp   Thank you for choosing [facility] !

## 2014-08-19 NOTE — Assessment & Plan Note (Signed)
I do not have detailed information. It sounds like he may have had mitral regurgitation and underwent an annular ring repair. Echocardiogram will be obtained.

## 2014-08-19 NOTE — Assessment & Plan Note (Signed)
Blood pressure is normal today. 

## 2014-08-19 NOTE — Assessment & Plan Note (Signed)
Recent LDL 66, on Crestor.

## 2014-08-19 NOTE — Assessment & Plan Note (Signed)
Symptomatically stable on medical therapy, ECG reviewed. He walks regularly, 3 miles at a time without angina symptoms. No clear indication for stress testing other than duration since last evaluation. Will continue observation for now. Echocardiogram will be obtained to reassess LVEF and valvular status.

## 2014-08-24 ENCOUNTER — Ambulatory Visit (HOSPITAL_COMMUNITY)
Admission: RE | Admit: 2014-08-24 | Discharge: 2014-08-24 | Disposition: A | Discharge status: Home / Self Care | Payer: Medicare Other | Source: Ambulatory Visit | Attending: Cardiology | Admitting: Cardiology

## 2014-08-24 DIAGNOSIS — I10 Essential (primary) hypertension: Secondary | ICD-10-CM | POA: Insufficient documentation

## 2014-08-24 DIAGNOSIS — I517 Cardiomegaly: Secondary | ICD-10-CM | POA: Insufficient documentation

## 2014-08-24 DIAGNOSIS — I2510 Atherosclerotic heart disease of native coronary artery without angina pectoris: Secondary | ICD-10-CM | POA: Diagnosis not present

## 2014-08-24 DIAGNOSIS — E785 Hyperlipidemia, unspecified: Secondary | ICD-10-CM | POA: Diagnosis not present

## 2014-08-24 DIAGNOSIS — I519 Heart disease, unspecified: Secondary | ICD-10-CM | POA: Insufficient documentation

## 2014-08-24 DIAGNOSIS — I77819 Aortic ectasia, unspecified site: Secondary | ICD-10-CM | POA: Diagnosis not present

## 2014-08-24 DIAGNOSIS — I059 Rheumatic mitral valve disease, unspecified: Secondary | ICD-10-CM

## 2014-08-24 DIAGNOSIS — I080 Rheumatic disorders of both mitral and aortic valves: Secondary | ICD-10-CM | POA: Insufficient documentation

## 2014-08-24 DIAGNOSIS — I359 Nonrheumatic aortic valve disorder, unspecified: Secondary | ICD-10-CM

## 2014-08-24 NOTE — Progress Notes (Signed)
  Echocardiogram 2D Echocardiogram has been performed.  [place], [place] 08/24/2014, 3:39 PM

## 2015-06-17 ENCOUNTER — Encounter: Payer: Self-pay | Admitting: Gastroenterology

## 2015-07-24 ENCOUNTER — Emergency Department (HOSPITAL_COMMUNITY): Payer: Medicare Other

## 2015-07-24 ENCOUNTER — Observation Stay (HOSPITAL_COMMUNITY): Payer: Medicare Other

## 2015-07-24 ENCOUNTER — Observation Stay (HOSPITAL_COMMUNITY)
Admission: EM | Admit: 2015-07-24 | Discharge: 2015-07-25 | Disposition: A | Discharge status: Home / Self Care | Payer: Medicare Other | Attending: Family Medicine | Admitting: Family Medicine

## 2015-07-24 ENCOUNTER — Encounter (HOSPITAL_COMMUNITY): Payer: Self-pay

## 2015-07-24 DIAGNOSIS — R200 Anesthesia of skin: Secondary | ICD-10-CM | POA: Diagnosis present

## 2015-07-24 DIAGNOSIS — E785 Hyperlipidemia, unspecified: Secondary | ICD-10-CM | POA: Diagnosis not present

## 2015-07-24 DIAGNOSIS — R202 Paresthesia of skin: Secondary | ICD-10-CM | POA: Diagnosis not present

## 2015-07-24 DIAGNOSIS — R51 Headache: Secondary | ICD-10-CM | POA: Insufficient documentation

## 2015-07-24 DIAGNOSIS — R208 Other disturbances of skin sensation: Secondary | ICD-10-CM

## 2015-07-24 DIAGNOSIS — G459 Transient cerebral ischemic attack, unspecified: Secondary | ICD-10-CM

## 2015-07-24 DIAGNOSIS — E782 Mixed hyperlipidemia: Secondary | ICD-10-CM | POA: Diagnosis not present

## 2015-07-24 DIAGNOSIS — Z951 Presence of aortocoronary bypass graft: Secondary | ICD-10-CM | POA: Diagnosis not present

## 2015-07-24 DIAGNOSIS — R531 Weakness: Secondary | ICD-10-CM | POA: Insufficient documentation

## 2015-07-24 DIAGNOSIS — I2510 Atherosclerotic heart disease of native coronary artery without angina pectoris: Secondary | ICD-10-CM | POA: Diagnosis not present

## 2015-07-24 DIAGNOSIS — Z79899 Other long term (current) drug therapy: Secondary | ICD-10-CM | POA: Insufficient documentation

## 2015-07-24 DIAGNOSIS — Z7982 Long term (current) use of aspirin: Secondary | ICD-10-CM | POA: Insufficient documentation

## 2015-07-24 DIAGNOSIS — E039 Hypothyroidism, unspecified: Secondary | ICD-10-CM

## 2015-07-24 DIAGNOSIS — I10 Essential (primary) hypertension: Secondary | ICD-10-CM | POA: Insufficient documentation

## 2015-07-24 HISTORY — DX: Hyperlipidemia, unspecified: E78.5

## 2015-07-24 HISTORY — DX: Hypothyroidism, unspecified: E03.9

## 2015-07-24 LAB — DIFFERENTIAL
Basophils Absolute: 0 10*3/uL (ref 0.0–0.1)
Basophils Relative: 0 % (ref 0–1)
EOS PCT: 4 % (ref 0–5)
Eosinophils Absolute: 0.3 10*3/uL (ref 0.0–0.7)
Lymphocytes Relative: 46 % (ref 12–46)
Lymphs Abs: 3.6 10*3/uL (ref 0.7–4.0)
Monocytes Absolute: 0.7 10*3/uL (ref 0.1–1.0)
Monocytes Relative: 9 % (ref 3–12)
NEUTROS ABS: 3.2 10*3/uL (ref 1.7–7.7)
NEUTROS PCT: 41 % — AB (ref 43–77)

## 2015-07-24 LAB — APTT: APTT: 28 s (ref 24–37)

## 2015-07-24 LAB — COMPREHENSIVE METABOLIC PANEL
ALT: 27 U/L (ref 17–63)
AST: 24 U/L (ref 15–41)
Albumin: 4.2 g/dL (ref 3.5–5.0)
Alkaline Phosphatase: 58 U/L (ref 38–126)
Anion gap: 8 (ref 5–15)
BUN: 14 mg/dL (ref 6–20)
CO2: 27 mmol/L (ref 22–32)
Calcium: 9.1 mg/dL (ref 8.9–10.3)
Chloride: 105 mmol/L (ref 101–111)
Creatinine, Ser: 1.1 mg/dL (ref 0.61–1.24)
GFR calc non Af Amer: >60 mL/min (ref >60)
GLUCOSE: 96 mg/dL (ref 65–99)
Potassium: 3.9 mmol/L (ref 3.5–5.1)
SODIUM: 140 mmol/L (ref 135–145)
Total Bilirubin: 0.7 mg/dL (ref 0.3–1.2)
Total Protein: 7.4 g/dL (ref 6.5–8.1)

## 2015-07-24 LAB — CBC
HCT: 45.6 % (ref 39.0–52.0)
HEMOGLOBIN: 15 g/dL (ref 13.0–17.0)
MCH: 29.7 pg (ref 26.0–34.0)
MCHC: 32.9 g/dL (ref 30.0–36.0)
MCV: 90.3 fL (ref 78.0–100.0)
PLATELETS: 146 10*3/uL — AB (ref 150–400)
RBC: 5.05 MIL/uL (ref 4.22–5.81)
RDW: 13 % (ref 11.5–15.5)
WBC: 7.9 10*3/uL (ref 4.0–10.5)

## 2015-07-24 LAB — PROTIME-INR
INR: 1.05 (ref 0.00–1.49)
PROTHROMBIN TIME: 13.9 s (ref 11.6–15.2)

## 2015-07-24 LAB — I-STAT TROPONIN, ED: Troponin i, poc: 0 ng/mL (ref 0.00–0.08)

## 2015-07-24 MED ORDER — SODIUM CHLORIDE 0.9 % IJ SOLN: 3.0000 mL | INTRAMUSCULAR | Status: DC | PRN

## 2015-07-24 MED ORDER — ACETAMINOPHEN 325 MG PO TABS: 650.0000 mg | ORAL_TABLET | ORAL | Status: DC | PRN

## 2015-07-24 MED ORDER — ALPRAZOLAM 0.5 MG PO TABS: 0.5000 mg | ORAL_TABLET | Freq: Every evening | ORAL | Status: DC | PRN

## 2015-07-24 MED ORDER — ASPIRIN 325 MG PO TABS
325.0000 mg | ORAL_TABLET | Freq: Every day | ORAL | Status: DC
  Administered 2015-07-25: 325 mg via ORAL
  Filled 2015-07-24: qty 1

## 2015-07-24 MED ORDER — METOPROLOL SUCCINATE ER 50 MG PO TB24
50.0000 mg | ORAL_TABLET | Freq: Every day | ORAL | Status: DC
  Administered 2015-07-24: 50 mg via ORAL
  Filled 2015-07-24: qty 1

## 2015-07-24 MED ORDER — SODIUM CHLORIDE 0.9 % IV SOLN: 250.0000 mL | INTRAVENOUS | Status: DC | PRN

## 2015-07-24 MED ORDER — LEVOTHYROXINE SODIUM 75 MCG PO TABS
75.0000 ug | ORAL_TABLET | Freq: Every day | ORAL | Status: DC
  Administered 2015-07-25: 75 ug via ORAL
  Filled 2015-07-24: qty 1

## 2015-07-24 MED ORDER — ENOXAPARIN SODIUM 40 MG/0.4ML ~~LOC~~ SOLN
40.0000 mg | SUBCUTANEOUS | Status: DC
Start: 2015-07-24 — End: 2015-07-25
  Administered 2015-07-24: 40 mg via SUBCUTANEOUS
  Filled 2015-07-24: qty 0.4

## 2015-07-24 MED ORDER — SODIUM CHLORIDE 0.9 % IJ SOLN
3.0000 mL | Freq: Two times a day (BID) | INTRAMUSCULAR | Status: DC
  Administered 2015-07-24 – 2015-07-25 (×2): 3 mL via INTRAVENOUS

## 2015-07-24 MED ORDER — ROSUVASTATIN CALCIUM 20 MG PO TABS
20.0000 mg | ORAL_TABLET | Freq: Every day | ORAL | Status: DC
  Administered 2015-07-24: 20 mg via ORAL
  Filled 2015-07-24: qty 1

## 2015-07-24 MED ORDER — ACETAMINOPHEN 650 MG RE SUPP: 650.0000 mg | RECTAL | Status: DC | PRN

## 2015-07-24 MED ORDER — STROKE: EARLY STAGES OF RECOVERY BOOK
Freq: Once | Status: AC
  Administered 2015-07-24: 1
  Filled 2015-07-24: qty 1

## 2015-07-24 NOTE — H&P (Signed)
History and Physical  Matthew Williams ZOX:[id number] DOB: 03/15/1946 DOA: 07/24/2015  Referring physician: Dr. Ralene Bathe in ED PCP: Curly Rim, MD   Chief Complaint: lips numbness  HPI:  69yom PMH CAD, HTN, hyperlipidemia presented to the emergency department with intermittent numbness of his extremities and an episode of facial numbness. No focal deficits were noted in the emergency department and initial workup including CT of the head was unremarkable. He is referred for further evaluation for possible TIA or stroke.  He reports he had an episode 8/3 of left arm tingling and numbness with some difficulty using his left hand. This spontaneously resolved over the course of approximately 45 minutes. He was driving at the time and had his left arm propped up on the door and thought this might have been the etiology. 8/5 he had an episode of tingling and numbness in his upper right arm which lasted less than 45 minutes and did not reach his hand. 8/6 he did not have any problems that he was aware of. 8/7 he had an episode of left facial tingling and numbness. When he spoke with his friend this morning, his friend pointed out that the patient had slurred speech last evening 8/6. Therefore patient came to the emergency department for further evaluation.  No history of stroke that he is aware of. He takes a daily aspirin including today. Currently has no symptoms of numbness or weakness. No difficulty speaking or swallowing. No weakness of arm or leg. He is an active person and walked his dog today.   In the emergency department afebrile, VSS, no hypoxia  Pertinent labs: CMP, CBC, troponin, glucose unremarkable. EKG: Independently reviewed. SR, RBBB, LPFB, no acute changes. 1127 and 1217. Imaging: CT head: no acute intracranial abnormality.  Review of Systems:  Negative for fever, visual changes, sore throat, rash, new muscle aches, chest pain, SOB, dysuria, bleeding, n/v/abdominal pain,  diarrhea.  Past Medical History  Diagnosis Date  . Syncope   . Coronary atherosclerosis of native coronary artery     Multivessel status post CABG  . Essential hypertension, benign   . Hyperlipidemia   . Hypothyroidism     Past Surgical History  Procedure Laterality Date  . Coronary artery bypass graft  1996  . Mitral valve repair    . Laparoscopic appendectomy    . Skin cancer excision    . Hernia repair      Social History:  reports that he has never smoked. He has never used smokeless tobacco. He reports that he drinks alcohol. He reports that he does not use illicit drugs. lives alone Self-care  No Known Allergies  Family History  Problem Relation Age of Onset  . Heart attack Brother      Prior to Admission medications   Medication Sig Start Date End Date Taking? Authorizing Provider  aspirin 325 MG tablet Take 325 mg by mouth daily.     Yes Historical Provider, MD  levothyroxine (SYNTHROID, LEVOTHROID) 75 MCG tablet Take 75 mcg by mouth daily.   Yes Historical Provider, MD  metoprolol succinate (TOPROL-XL) 50 MG 24 hr tablet Take 50 mg by mouth at bedtime. Take with or immediately following a meal.   Yes Historical Provider, MD  rosuvastatin (CRESTOR) 40 MG tablet Take 20 mg by mouth daily.    Yes Historical Provider, MD  valsartan (DIOVAN) 80 MG tablet Take 80 mg by mouth daily as needed. Pt not taking this everyday only when bp is high   Yes Historical  Provider, MD  ALPRAZolam Duanne Moron) 0.5 MG tablet Take 0.5 mg by mouth at bedtime as needed for sleep.     Historical Provider, MD  nitroGLYCERIN (NITROSTAT) 0.4 MG SL tablet Place 1 tablet (0.4 mg total) under the tongue as directed. Patient taking differently: Place 0.4 mg under the tongue every 5 (five) minutes as needed for chest pain.  08/05/12   Renella Cunas, MD   Physical Exam: Filed Vitals:   07/24/15 1132 07/24/15 1159  BP: 164/78   Pulse: 67   Temp: 97.7 F (36.5 C) 97.7 F (36.5 C)  TempSrc: Oral    Resp: 23   Height: 5\' 11"  (1.803 m)   Weight: 95.255 kg (210 lb)   SpO2: 96%     General:  Appears calm and comfortable Eyes: PERRL, normal lids, irises   ENT: grossly normal hearing, lips & tongue Neck: no LAD, masses or thyromegaly Cardiovascular: RRR, no m/r/g. Prominent S2. No LE edema. Respiratory: CTA bilaterally, no w/r/r. Normal respiratory effort. Abdomen: soft, ntnd Skin: no rash or induration noted Musculoskeletal: grossly normal tone BUE/BLE. Strength 5/5 BLE and BLE symmetric Psychiatric: grossly normal mood and affect, speech fluent and appropriate Neurologic: CN appear intact. No pass pointing UE. Normal heel-to-shin. No pronator drift. Negative Romberg.  Wt Readings from Last 3 Encounters:  07/24/15 95.255 kg (210 lb)  08/19/14 97.07 kg (214 lb)  08/03/13 98.884 kg (218 lb)    Labs on Admission:  Basic Metabolic Panel:  Recent Labs Lab 07/24/15 1203  NA 140  K 3.9  CL 105  CO2 27  GLUCOSE 96  BUN 14  CREATININE 1.10  CALCIUM 9.1    Liver Function Tests:  Recent Labs Lab 07/24/15 1203  AST 24  ALT 27  ALKPHOS 58  BILITOT 0.7  PROT 7.4  ALBUMIN 4.2   CBC:  Recent Labs Lab 07/24/15 1203  WBC 7.9  NEUTROABS 3.2  HGB 15.0  HCT 45.6  MCV 90.3  PLT 146*     Recent Labs  07/24/15 1200  TROPIPOC 0.00    Radiological Exams on Admission: Ct Head Wo Contrast  07/24/2015   CLINICAL DATA:  69 year old male with a history of intermittent arm numbness for 5 days.  EXAM: CT HEAD WITHOUT CONTRAST  TECHNIQUE: Contiguous axial images were obtained from the base of the skull through the vertex without intravenous contrast.  COMPARISON:  None.  FINDINGS: Unremarkable appearance of the calvarium without acute fracture or aggressive lesion.  Unremarkable appearance of the scalp soft tissues.  Unremarkable appearance of the bilateral orbits.  Partial imaging of plate and screw fixation of the left orbital floor.  Mastoid air cells are clear.  No  significant paranasal sinus disease  No acute intracranial hemorrhage. No midline shift or mass effect. Gray-white differentiation relatively maintained.  Unremarkable configuration of the ventricles.  Intracranial atherosclerosis.  Focal hypodensity in the posterior right cerebellum.  IMPRESSION: No CT evidence of acute intracranial abnormality.  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 No comparison available.  Intracranial atherosclerosis.  Signed,  Dulcy Fanny. Earleen Newport, DO  [facility]  [facility]   Electronically Signed   By: Corrie Mckusick D.O.   On: 07/24/2015 12:10      Principal Problem:   Left facial numbness Active Problems:   Coronary atherosclerosis of native coronary artery   Mixed hyperlipidemia   Essential hypertension, benign   Hypothyroidism   TIA (transient ischemic attack)   Assessment/Plan 1.  Left facial numbness today, resolved. Reported slurred speech 8/6. Reported left arm numbness 8/3 and right arm numbness 8/5. No current neurologic deficits. Symptoms concerning for small strokes or TIAs. Not a candidate for tPA secondary to duration of symptoms and lack of focal findings. 2. Suspected TIA vs CVA. Had ASA at home today. 3. CAD, s/p CABG, MV repair. Stable. 4. HTN, stable. He does not take valsartan regularly. 5. Hypothryoidism.    Plan observation overnight.  Telemetry, stroke evaluation including echocardiogram, bilateral carotid Dopplers, MRI brain, MRA head. Check fasting lipid panel and hemoglobin A1c.  Therapy evaluations.  Continue aspirin for now.  Code Status: full code  DVT prophylaxis: Lovenox Family Communication: None present. Patient alert and understands plan. Disposition Plan/Anticipated LOS: Observation. <24 hours.  Time spent: 50 minutes  Murray Hodgkins, MD  [facility] Pager [phone] 07/24/2015, 1:17 PM

## 2015-07-24 NOTE — ED Notes (Signed)
Pt reports left arm tingly Wednesday , started having headache, and lips were numb.  Reports symptoms lasted approx 15-66min.  Pt reports Friday or Saturday he noticed r arm was numb and had headache again but went away after a few min.   Pt says last night he was talking to a friend on the phone and his friend told him his speech seemed slurred.  Pt reports went out for a walk this morning and approx 1 hour ago was sitting on the couch and lips and left side of face were numb.  Also c/o headache and nausea.

## 2015-07-24 NOTE — ED Provider Notes (Signed)
CSN: [id number]     Arrival date & time 07/24/15  1117 History  This chart was scribed for Quintella Reichert, MD by Hilda Lias, ED Scribe. This patient was seen in room APA08/APA08 and the patient's care was started at 11:33 AM.    Chief Complaint  Patient presents with  . Numbness      The history is provided by the patient. No language interpreter was used.     HPI Comments: Matthew Williams is a 69 y.o. male who presents to the Emergency Department complaining of intermittent numbness with associated intermittent headache and generalized weakness that has been present for a few weeks. Pt reports having numbness in his face and his arms that radiate down to his hands while he was riding in the car with his arm out the window a few weeks ago, and notes that he has had similar symptoms a few times since then. Pt states that this morning he had numbness on the left side of his face while he was eating breakfast, and states that one of his friends told him last night that he was slurring his speech. Pt reports currently having a headache and having numbness on the left side of his face.     Past Medical History  Diagnosis Date  . Syncope   . Coronary atherosclerosis of native coronary artery     Multivessel status post CABG  . Essential hypertension, benign   . Hyperlipidemia    Past Surgical History  Procedure Laterality Date  . Coronary artery bypass graft  1996  . Mitral valve repair    . Laparoscopic appendectomy    . Skin cancer excision    . Hernia repair     Family History  Problem Relation Age of Onset  . Heart attack Brother    History  Substance Use Topics  . Smoking status: Never Smoker   . Smokeless tobacco: Never Used  . Alcohol Use: Yes     Comment: occ    Review of Systems  Neurological: Positive for weakness, numbness and headaches.  All other systems reviewed and are negative.     Allergies  Review of patient's allergies indicates no known  allergies.  Home Medications   Prior to Admission medications   Medication Sig Start Date End Date Taking? Authorizing Provider  aspirin 325 MG tablet Take 325 mg by mouth daily.     Yes Historical Provider, MD  levothyroxine (SYNTHROID, LEVOTHROID) 75 MCG tablet Take 75 mcg by mouth daily.   Yes Historical Provider, MD  metoprolol succinate (TOPROL-XL) 50 MG 24 hr tablet Take 50 mg by mouth at bedtime. Take with or immediately following a meal.   Yes Historical Provider, MD  rosuvastatin (CRESTOR) 40 MG tablet Take 20 mg by mouth daily.    Yes Historical Provider, MD  valsartan (DIOVAN) 80 MG tablet Take 80 mg by mouth daily as needed. Pt not taking this everyday only when bp is high   Yes Historical Provider, MD  ALPRAZolam (XANAX) 0.5 MG tablet Take 0.5 mg by mouth at bedtime as needed for sleep.     Historical Provider, MD  nitroGLYCERIN (NITROSTAT) 0.4 MG SL tablet Place 1 tablet (0.4 mg total) under the tongue as directed. Patient taking differently: Place 0.4 mg under the tongue every 5 (five) minutes as needed for chest pain.  08/05/12   Renella Cunas, MD   BP 156/86 mmHg  Pulse 65  Temp(Src) 97.8 F (36.6 C) (Oral)  Resp 16  Ht 5\' 11"  (1.803 m)  Wt 205 lb 7.5 oz (93.2 kg)  BMI 28.67 kg/m2  SpO2 97% Physical Exam  Constitutional: He is oriented to person, place, and time. He appears well-developed and well-nourished.  HENT:  Head: Normocephalic and atraumatic.  Cardiovascular: Normal rate and regular rhythm.   No murmur heard. Pulmonary/Chest: Effort normal and breath sounds normal. No respiratory distress.  Abdominal: Soft. There is no tenderness. There is no rebound and no guarding.  Musculoskeletal: He exhibits no edema or tenderness.  Neurological: He is alert and oriented to person, place, and time. No cranial nerve deficit. Coordination normal.  5/5 strength in all four extremities, sensation to light touch intact in all four extremities, no pronator drift.   Skin:  Skin is warm and dry.  Psychiatric: He has a normal mood and affect. His behavior is normal.  Nursing note and vitals reviewed.   ED Course  Procedures (including critical care time)  DIAGNOSTIC STUDIES: Oxygen Saturation is 96% on room air, normal by my interpretation.    COORDINATION OF CARE: 11:39 AM Discussed treatment plan with pt at bedside and pt agreed to plan.   Labs Review Labs Reviewed  CBC - Abnormal; Notable for the following:    Platelets 146 (*)    All other components within normal limits  DIFFERENTIAL - Abnormal; Notable for the following:    Neutrophils Relative % 41 (*)    All other components within normal limits  PROTIME-INR  APTT  COMPREHENSIVE METABOLIC PANEL  URINALYSIS, ROUTINE W REFLEX MICROSCOPIC (NOT AT [facility])  Randolm Idol, ED    Imaging Review Ct Head Wo Contrast  07/24/2015   CLINICAL DATA:  69 year old male with a history of intermittent arm numbness for 5 days.  EXAM: CT HEAD WITHOUT CONTRAST  TECHNIQUE: Contiguous axial images were obtained from the base of the skull through the vertex without intravenous contrast.  COMPARISON:  None.  FINDINGS: Unremarkable appearance of the calvarium without acute fracture or aggressive lesion.  Unremarkable appearance of the scalp soft tissues.  Unremarkable appearance of the bilateral orbits.  Partial imaging of plate and screw fixation of the left orbital floor.  Mastoid air cells are clear.  No significant paranasal sinus disease  No acute intracranial hemorrhage. No midline shift or mass effect. Gray-white differentiation relatively maintained.  Unremarkable configuration of the ventricles.  Intracranial atherosclerosis.  Focal hypodensity in the posterior right cerebellum.  IMPRESSION: No CT evidence of acute intracranial abnormality.  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 No comparison available.  Intracranial atherosclerosis.  Signed,  Dulcy Fanny. Earleen Newport, DO   [facility]  [facility]   Electronically Signed   By: Corrie Mckusick D.O.   On: 07/24/2015 12:10     EKG Interpretation   Date/Time:  Sunday July 24 2015 11:27:35 EDT Ventricular Rate:  70 PR Interval:    QRS Duration: 139 QT Interval:  435 QTC Calculation: 469 R Axis:   94 Text Interpretation:  Normal sinus rhythm artifact present RBBB and LPFB  Confirmed by Hazle Coca [phone]) on 07/24/2015 12:16:52 PM      MDM   Final diagnoses:  None    Patient here for evaluation of intermittent numbness to left arm, face, right arm, intermittent slurred speech. Patient is nonfocal on examination with no neurologic deficits. Discussed with hospitalist regarding admission for TIA workup.  I personally performed the services described in this documentation, which was scribed in my presence. The  recorded information has been reviewed and is accurate.    Quintella Reichert, MD 07/24/15 [phone]

## 2015-07-24 NOTE — Progress Notes (Signed)
Stroke mapping book given to patient. Stroke education performed with patient including, signs and symptoms of stroke, types of stroke, when to call the doctor, and treatments. Patient verbalized understanding. Will continue stroke education with patient until time of discharge.

## 2015-07-24 NOTE — ED Notes (Signed)
edp at bedside  

## 2015-07-25 ENCOUNTER — Observation Stay (HOSPITAL_COMMUNITY): Payer: Medicare Other

## 2015-07-25 ENCOUNTER — Observation Stay (HOSPITAL_BASED_OUTPATIENT_CLINIC_OR_DEPARTMENT_OTHER): Payer: Medicare Other

## 2015-07-25 DIAGNOSIS — G459 Transient cerebral ischemic attack, unspecified: Secondary | ICD-10-CM

## 2015-07-25 DIAGNOSIS — R202 Paresthesia of skin: Secondary | ICD-10-CM | POA: Diagnosis not present

## 2015-07-25 DIAGNOSIS — R208 Other disturbances of skin sensation: Secondary | ICD-10-CM | POA: Diagnosis not present

## 2015-07-25 DIAGNOSIS — E782 Mixed hyperlipidemia: Secondary | ICD-10-CM | POA: Diagnosis not present

## 2015-07-25 LAB — LIPID PANEL
CHOL/HDL RATIO: 4.4 ratio
CHOLESTEROL: 128 mg/dL (ref 0–200)
HDL: 29 mg/dL — ABNORMAL LOW (ref >40)
LDL Cholesterol: 69 mg/dL (ref 0–99)
Triglycerides: 149 mg/dL (ref <150)
VLDL: 30 mg/dL (ref 0–40)

## 2015-07-25 LAB — URINALYSIS, ROUTINE W REFLEX MICROSCOPIC
BILIRUBIN URINE: NEGATIVE
GLUCOSE, UA: NEGATIVE mg/dL
Hgb urine dipstick: NEGATIVE
KETONES UR: NEGATIVE mg/dL
LEUKOCYTES UA: NEGATIVE
Nitrite: NEGATIVE
Protein, ur: NEGATIVE mg/dL
SPECIFIC GRAVITY, URINE: 1.01 (ref 1.005–1.030)
Urobilinogen, UA: 0.2 mg/dL (ref 0.0–1.0)
pH: 8 (ref 5.0–8.0)

## 2015-07-25 MED ORDER — CLOPIDOGREL BISULFATE 75 MG PO TABS
75.0000 mg | ORAL_TABLET | Freq: Every day | ORAL | Status: DC
  Administered 2015-07-25: 75 mg via ORAL
  Filled 2015-07-25: qty 1

## 2015-07-25 MED ORDER — ASPIRIN 81 MG PO TABS: 81.0000 mg | ORAL_TABLET | Freq: Every day | ORAL | Status: DC

## 2015-07-25 MED ORDER — CLOPIDOGREL BISULFATE 75 MG PO TABS: 75.0000 mg | ORAL_TABLET | Freq: Every day | ORAL | Status: AC

## 2015-07-25 NOTE — Progress Notes (Signed)
PROGRESS NOTE  Matthew Williams WUJ:[id number] DOB: [birth year] DOA: 07/24/2015 PCP: Curly Rim, MD  Summary: 64yom PMH CAD, HTN, hyperlipidemia presented to the emergency department with intermittent numbness of his extremities and an episode of facial numbness. No focal deficits were noted in the emergency department and initial workup including CT of the head was unremarkable. He is referred for further evaluation for possible TIA or stroke.  Assessment/Plan: 1. Left facial numbness, slurred speech, left arm and right arm numbness over the last several days. Essentially resolved. 2. Suspected TIA. On aspirin at home. LDL 69, carotid ultrasound and echocardiogram unremarkable. MRI brain negative. MRA head unremarkable. 3. CAD, status post CABG, mitral valve repair 4. Hypertension 5. Hypothyroidism   Overall improved. Ambulates well. No difficulty using arms or speaking or swallowing. Will not delay discharge for therapy evaluations as I don't think they are indicated at this point.  Change to low dose ASA, add Plavix  Home today  Recommend outpatient follow-up with neurology in one month  Murray Hodgkins, MD  [facility]  Pager [phone] If 7PM-7AM, please contact night-coverage at [email or web address], password [facility] 07/25/2015, 1:01 PM    Consultants:  No neurology available today  Procedures:  2d echo Study Conclusions  - Left ventricle: There was mild concentric hypertrophy. Systolic function was normal. The estimated ejection fraction was in the range of 60% to 65%. Wall motion was normal; there were no regional wall motion abnormalities. Features are consistent with a pseudonormal left ventricular filling pattern, with concomitant abnormal relaxation and increased filling pressure (grade 2 diastolic dysfunction). Doppler parameters are consistent with high ventricular filling pressure. - Aortic valve: Mildly calcified annulus. Mildly thickened,  mildly calcified leaflets. There was moderate regurgitation. - Mitral valve: Possible prior mitral ring annuloplasty with moderate stenosis. Moderately calcified annulus. Mildly thickened leaflets . There was mild regurgitation. Mean gradient (D): 8 mm Hg. Valve area by pressure half-time: 1.38 cm^2. - Left atrium: The atrium was moderately dilated. Volume/bsa, S: 35 ml/m^2. - Pulmonary arteries: PA peak pressure: 33 mm Hg (S).  Antibiotics:    HPI/Subjective: Feels good. No numbness of arms. No difficulty speaking or swallowing. No weakness. No problems walking. No difficulty using arms.  Objective: Filed Vitals:   07/25/15 0200 07/25/15 0400 07/25/15 0800 07/25/15 1200  BP: 149/92 99/55 139/70 128/66  Pulse: 56 61 57 72  Temp: 97.8 F (36.6 C) 98 F (36.7 C) 98 F (36.7 C) 98.2 F (36.8 C)  TempSrc: Oral Oral Oral Oral  Resp: 16 16 20 20   Height:      Weight:      SpO2: 98% 98% 97% 98%    Intake/Output Summary (Last 24 hours) at 07/25/15 1301 Last data filed at 07/25/15 1219  Gross per 24 hour  Intake    240 ml  Output    625 ml  Net   -385 ml     Filed Weights   07/24/15 1132 07/24/15 1434  Weight: 95.255 kg (210 lb) 93.2 kg (205 lb 7.5 oz)    Exam:     Afebrile, VSS, no hypoxia General:  Appears calm and comfortable Eyes: PERRL, normal lids, irises   ENT: grossly normal hearing, lips & tongue Neck: no LAD, masses or thyromegaly Cardiovascular: RRR, no m/r/g. No LE edema. Telemetry: SR, no arrhythmias   Respiratory: CTA bilaterally, no w/r/r. Normal respiratory effort. Abdomen: soft, ntnd Skin: no rash or induration seen on limited exam Musculoskeletal: grossly normal tone BUE/BLE Psychiatric: grossly normal mood and affect, speech  fluent and appropriate Neurologic: CN appear intact. No pronator drift. Up off bed and walks the hall with normal gait, no loss of balance.  New data reviewed:  LDL 69  Plaque at the level of both carotid  bulbs and proximal internal carotid arteries, left greater than right. No significant stenosis is identified with estimated bilateral ICA stenoses of less than 50%.  MRI brain and MRA head unremarkable  Pertinent data since admission:    Pending data:    Scheduled Meds: . aspirin  325 mg Oral Daily  . enoxaparin (LOVENOX) injection  40 mg Subcutaneous Q24H  . levothyroxine  75 mcg Oral QAC breakfast  . metoprolol succinate  50 mg Oral QHS  . rosuvastatin  20 mg Oral q1800  . sodium chloride  3 mL Intravenous Q12H   Continuous Infusions:   Principal Problem:   Left facial numbness Active Problems:   Coronary atherosclerosis of native coronary artery   Mixed hyperlipidemia   Essential hypertension, benign   Hypothyroidism   TIA (transient ischemic attack)

## 2015-07-25 NOTE — Discharge Summary (Signed)
Physician Discharge Summary  Matthew Williams BSJ:[id number] DOB: [birth year] DOA: 07/24/2015  PCP: Curly Rim, MD  Admit date: 07/24/2015 Discharge date: 07/25/2015  Recommendations for Outpatient Follow-up:  1. Follow-up suspect TIA. Plavix added.  2. Recommend neurology follow-up in one month.   Follow-up Information    Follow up with CORRINGTON,KIP A, MD. Schedule an appointment as soon as possible for a visit in 2 weeks.   Specialty:  Family Medicine   Contact information:   [place] [address] [place] [address] [phone]       Follow up with [facility], Trey Sailors, MD In 1 month.   Specialty:  Neurology   Contact information:   [address] [place] [address] [phone]      Discharge Diagnoses:  1. Left facial numbness with slurred speech, left arm and right arm numbness 2. Suspected TIA 3. Hypertension 4. CAD  Discharge Condition: improved Disposition: home  Diet recommendation: heart healthy  Filed Weights   07/24/15 1132 07/24/15 1434  Weight: 95.255 kg (210 lb) 93.2 kg (205 lb 7.5 oz)    History of present illness:  69yom PMH CAD, HTN, hyperlipidemia presented to the emergency department with intermittent numbness of his extremities and an episode of facial numbness. No focal deficits were noted in the emergency department and initial workup including CT of the head was unremarkable. He is referred for further evaluation for possible TIA or stroke.  Hospital Course:  Mr. Tebbetts was observed overnight. He had no focal neurologic deficits. His workup was unrevealing. His history is most suggestive of TIA and Plavix was added to his medical regimen. He ambulated well with no loss of balance. He no difficulty using his arms, speaking or swallowing. Therapy evaluations which delayed his discharge and I do not think they are indicated in this particular case.   Left facial numbness, slurred speech, left arm and right arm numbness over the last  several days. Essentially resolved.  Suspected TIA. On aspirin at home. LDL 69, carotid ultrasound and echocardiogram unremarkable. MRI brain negative. MRA head unremarkable.  CAD, status post CABG, mitral valve repair  Hypertension  Hypothyroidism  Consultants:  No neurology available today  Procedures:  2d echo Study Conclusions  - Left ventricle: There was mild concentric hypertrophy. Systolic function was normal. The estimated ejection fraction was in the range of 60% to 65%. Wall motion was normal; there were no regional wall motion abnormalities. Features are consistent with a pseudonormal left ventricular filling pattern, with concomitant abnormal relaxation and increased filling pressure (grade 2 diastolic dysfunction). Doppler parameters are consistent with high ventricular filling pressure. - Aortic valve: Mildly calcified annulus. Mildly thickened, mildly calcified leaflets. There was moderate regurgitation. - Mitral valve: Possible prior mitral ring annuloplasty with moderate stenosis. Moderately calcified annulus. Mildly thickened leaflets . There was mild regurgitation. Mean gradient (D): 8 mm Hg. Valve area by pressure half-time: 1.38 cm^2. - Left atrium: The atrium was moderately dilated. Volume/bsa, S: 35 ml/m^2. - Pulmonary arteries: PA peak pressure: 33 mm Hg (S).  Discharge Instructions  Discharge Instructions    Activity as tolerated - No restrictions    Complete by:  As directed      Diet - low sodium heart healthy    Complete by:  As directed      Discharge instructions    Complete by:  As directed   Call your physician or seek immediate medical attention for weakness, numbness, tingling, falls or worsening of condition.  Discharge Medication List as of 07/25/2015  2:23 PM    START taking these medications   Details  clopidogrel (PLAVIX) 75 MG tablet Take 1 tablet (75 mg total) by mouth daily., Starting  07/25/2015, Until Discontinued, Normal      CONTINUE these medications which have CHANGED   Details  aspirin 81 MG tablet Take 1 tablet (81 mg total) by mouth daily., Starting 07/25/2015, Until Discontinued, No Print      CONTINUE these medications which have NOT CHANGED   Details  levothyroxine (SYNTHROID, LEVOTHROID) 75 MCG tablet Take 75 mcg by mouth daily., Until Discontinued, Historical Med    metoprolol succinate (TOPROL-XL) 50 MG 24 hr tablet Take 50 mg by mouth at bedtime. Take with or immediately following a meal., Until Discontinued, Historical Med    rosuvastatin (CRESTOR) 40 MG tablet Take 20 mg by mouth daily. , Until Discontinued, Historical Med    ALPRAZolam (XANAX) 0.5 MG tablet Take 0.5 mg by mouth at bedtime as needed for sleep. , Until Discontinued, Historical Med    nitroGLYCERIN (NITROSTAT) 0.4 MG SL tablet Place 1 tablet (0.4 mg total) under the tongue as directed., Starting 08/05/2012, Until Discontinued, Normal      STOP taking these medications     valsartan (DIOVAN) 80 MG tablet        No Known Allergies  The results of significant diagnostics from this hospitalization (including imaging, microbiology, ancillary and laboratory) are listed below for reference.    Significant Diagnostic Studies: Dg Chest 2 View  07/24/2015   CLINICAL DATA:  TIA, arm numbness  EXAM: CHEST  2 VIEW  COMPARISON:  05/03/2008  FINDINGS: Lungs are gross clear. No focal consolidation. No pleural effusion or pneumothorax.  Mild cardiomegaly.  Postsurgical changes related to prior CABG.  Mild degenerative changes of the upper lumbar spine.  IMPRESSION: No evidence of acute cardiopulmonary disease.   Electronically Signed   By: Julian Hy M.D.   On: 07/24/2015 17:43   Ct Head Wo Contrast  07/24/2015   CLINICAL DATA:  69 year old male with a history of intermittent arm numbness for 5 days.  EXAM: CT HEAD WITHOUT CONTRAST  TECHNIQUE: Contiguous axial images were obtained from the base  of the skull through the vertex without intravenous contrast.  COMPARISON:  None.  FINDINGS: Unremarkable appearance of the calvarium without acute fracture or aggressive lesion.  Unremarkable appearance of the scalp soft tissues.  Unremarkable appearance of the bilateral orbits.  Partial imaging of plate and screw fixation of the left orbital floor.  Mastoid air cells are clear.  No significant paranasal sinus disease  No acute intracranial hemorrhage. No midline shift or mass effect. Gray-white differentiation relatively maintained.  Unremarkable configuration of the ventricles.  Intracranial atherosclerosis.  Focal hypodensity in the posterior right cerebellum.  IMPRESSION: No CT evidence of acute intracranial abnormality.  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 No comparison available.  Intracranial atherosclerosis.  Signed,  Dulcy Fanny. Earleen Newport, DO  [facility]  [facility]   Electronically Signed   By: Corrie Mckusick D.O.   On: 07/24/2015 12:10   Mr Jodene Nam Head Wo Contrast  07/25/2015   CLINICAL DATA:  Syncope with LEFT facial numbness for 2 days. Stroke risk factors include hyperlipidemia and hypertension.  Initial encounter.  EXAM: MRI HEAD WITHOUT CONTRAST  MRA HEAD WITHOUT CONTRAST  TECHNIQUE: Multiplanar, multiecho pulse sequences of the brain and surrounding structures were obtained without intravenous contrast. Angiographic images of  the head were obtained using MRA technique without contrast.  COMPARISON:  CT head 07/24/2015.  FINDINGS: MRI HEAD FINDINGS  No evidence for acute infarction, hemorrhage, mass lesion, hydrocephalus, or extra-axial fluid. Normal for age cerebral volume. Mild subcortical and periventricular T2 and FLAIR hyperintensities, likely chronic microvascular ischemic change. Remote RIGHT cerebellar infarct.  Remote RIGHT posterior temporal white matter lacunar infarct. Remote RIGHT parietal  subcortical infarct. No foci of chronic hemorrhage.  Extracranial soft tissues grossly unremarkable. Partial empty sella. No tonsillar herniation.  MRA HEAD FINDINGS  The internal carotid arteries are widely patent. The basilar artery is widely patent. RIGHT vertebral is dominant. No intracranial stenosis or aneurysm.  Good general agreement prior CT. The questioned area of hypoattenuation on CT in the RIGHT cerebellum does indeed represent an old infarct.  IMPRESSION: Chronic changes as described. No acute intracranial abnormality. Specifically no stroke or visible hemorrhage.  Partial empty sella.  Unremarkable intracranial MRA.  No proximal flow reducing lesion.   Electronically Signed   By: Staci Righter M.D.   On: 07/25/2015 10:25   Mr Brain Wo Contrast  07/25/2015   CLINICAL DATA:  Syncope with LEFT facial numbness for 2 days. Stroke risk factors include hyperlipidemia and hypertension.  Initial encounter.  EXAM: MRI HEAD WITHOUT CONTRAST  MRA HEAD WITHOUT CONTRAST  TECHNIQUE: Multiplanar, multiecho pulse sequences of the brain and surrounding structures were obtained without intravenous contrast. Angiographic images of the head were obtained using MRA technique without contrast.  COMPARISON:  CT head 07/24/2015.  FINDINGS: MRI HEAD FINDINGS  No evidence for acute infarction, hemorrhage, mass lesion, hydrocephalus, or extra-axial fluid. Normal for age cerebral volume. Mild subcortical and periventricular T2 and FLAIR hyperintensities, likely chronic microvascular ischemic change. Remote RIGHT cerebellar infarct.  Remote RIGHT posterior temporal white matter lacunar infarct. Remote RIGHT parietal subcortical infarct. No foci of chronic hemorrhage.  Extracranial soft tissues grossly unremarkable. Partial empty sella. No tonsillar herniation.  MRA HEAD FINDINGS  The internal carotid arteries are widely patent. The basilar artery is widely patent. RIGHT vertebral is dominant. No intracranial stenosis or  aneurysm.  Good general agreement prior CT. The questioned area of hypoattenuation on CT in the RIGHT cerebellum does indeed represent an old infarct.  IMPRESSION: Chronic changes as described. No acute intracranial abnormality. Specifically no stroke or visible hemorrhage.  Partial empty sella.  Unremarkable intracranial MRA.  No proximal flow reducing lesion.   Electronically Signed   By: Staci Righter M.D.   On: 07/25/2015 10:25   US Carotid Bilateral  07/25/2015   CLINICAL DATA:  TIA, hypertension, syncope, coronary artery disease and hyperlipidemia.  EXAM: BILATERAL CAROTID DUPLEX ULTRASOUND  TECHNIQUE: Pearline Cables scale imaging, color Doppler and duplex ultrasound were performed of bilateral carotid and vertebral arteries in the neck.  COMPARISON:  None.  FINDINGS: Criteria: Quantification of carotid stenosis is based on velocity parameters that correlate the residual internal carotid diameter with NASCET-based stenosis levels, using the diameter of the distal internal carotid lumen as the denominator for stenosis measurement.  The following velocity measurements were obtained:  RIGHT  ICA:  77/19 cm/sec  CCA:  40/34 cm/sec  SYSTOLIC ICA/CCA RATIO:  0.9  DIASTOLIC ICA/CCA RATIO:  1.3  ECA:  100 cm/sec  LEFT  ICA:  86/15 cm/sec  CCA:  742/59 cm/sec  SYSTOLIC ICA/CCA RATIO:  0.8  DIASTOLIC ICA/CCA RATIO:  0.9  ECA:  112 cm/sec  RIGHT CAROTID ARTERY: Mild amount of partially calcified plaque is present at the level of the carotid  bulb and proximal ICA. Velocities and waveforms are within normal limits. Estimated right ICA stenosis is less than 50%.  RIGHT VERTEBRAL ARTERY: Antegrade flow with normal waveform and velocity.  LEFT CAROTID ARTERY: Mild to moderate amount of calcified plaque is present at the level of the carotid bulb and proximal ICA. Velocities and waveforms are normal. Estimated left ICA stenosis is less than 50%.  LEFT VERTEBRAL ARTERY: Antegrade flow with normal waveform and velocity.  IMPRESSION:  Plaque at the level of both carotid bulbs and proximal internal carotid arteries, left greater than right. No significant stenosis is identified with estimated bilateral ICA stenoses of less than 50%.   Electronically Signed   By: Aletta Edouard M.D.   On: 07/25/2015 11:26     Labs: Basic Metabolic Panel:  Recent Labs Lab 07/24/15 1203  NA 140  K 3.9  CL 105  CO2 27  GLUCOSE 96  BUN 14  CREATININE 1.10  CALCIUM 9.1   Liver Function Tests:  Recent Labs Lab 07/24/15 1203  AST 24  ALT 27  ALKPHOS 58  BILITOT 0.7  PROT 7.4  ALBUMIN 4.2    CBC:  Recent Labs Lab 07/24/15 1203  WBC 7.9  NEUTROABS 3.2  HGB 15.0  HCT 45.6  MCV 90.3  PLT 146*      Principal Problem:   Left facial numbness Active Problems:   Coronary atherosclerosis of native coronary artery   Mixed hyperlipidemia   Essential hypertension, benign   Hypothyroidism   TIA (transient ischemic attack)   Time coordinating discharge: 30 minutes  Signed:  Murray Hodgkins, MD [facility] 07/25/2015, 7:03 PM

## 2015-07-25 NOTE — Progress Notes (Signed)
Patient states understanding of discharge instructions,  

## 2015-07-26 ENCOUNTER — Encounter: Payer: Self-pay | Admitting: Cardiology

## 2015-07-26 LAB — HEMOGLOBIN A1C
HEMOGLOBIN A1C: 6 % — AB (ref 4.8–5.6)
MEAN PLASMA GLUCOSE: 126 mg/dL

## 2015-08-23 ENCOUNTER — Ambulatory Visit: Payer: Medicare Other | Admitting: Cardiology

## 2015-09-07 ENCOUNTER — Encounter: Payer: Self-pay | Admitting: Cardiology

## 2015-09-07 ENCOUNTER — Ambulatory Visit (INDEPENDENT_AMBULATORY_CARE_PROVIDER_SITE_OTHER): Payer: Medicare Other | Admitting: Cardiology

## 2015-09-07 ENCOUNTER — Encounter: Payer: Self-pay | Admitting: *Deleted

## 2015-09-07 VITALS — BP 123/74 | HR 64 | Ht 71.0 in | Wt 203.8 lb

## 2015-09-07 DIAGNOSIS — E782 Mixed hyperlipidemia: Secondary | ICD-10-CM

## 2015-09-07 DIAGNOSIS — I059 Rheumatic mitral valve disease, unspecified: Secondary | ICD-10-CM | POA: Diagnosis not present

## 2015-09-07 DIAGNOSIS — I2510 Atherosclerotic heart disease of native coronary artery without angina pectoris: Secondary | ICD-10-CM | POA: Diagnosis not present

## 2015-09-07 DIAGNOSIS — I10 Essential (primary) hypertension: Secondary | ICD-10-CM

## 2015-09-07 DIAGNOSIS — Z8673 Personal history of transient ischemic attack (TIA), and cerebral infarction without residual deficits: Secondary | ICD-10-CM

## 2015-09-07 NOTE — Patient Instructions (Signed)
Your physician recommends that you continue on your current medications as directed. Please refer to the Current Medication list given to you today. Your physician has requested that you have a lexiscan myoview. For further information please visit [email or web address]. Please follow instruction sheet, as given. Your physician recommends that you schedule a follow-up appointment in: 1 year. You will receive a reminder letter in the mail in about 10 months reminding you to call and schedule your appointment. If you don't receive this letter, please contact our office. 

## 2015-09-07 NOTE — Progress Notes (Signed)
Cardiology Office Note  Date: 09/07/2015   ID: Javonnie, Illescas 06/24/1946, MRN [id number]  PCP: Curly Rim, MD  Primary Cardiologist: Rozann Lesches, MD   Chief Complaint  Patient presents with  . Coronary Artery Disease  . Mitral valve disease    History of Present Illness: ZHYON ANTENUCCI is a 69 y.o. male seen by me for the first time in September 2015, a former patient of Dr. Verl Blalock. Record review finds hospitalization in August of this year with suspected TIA manifested as left facial numbness and slurred speech, left arm and right leg numbness - all transient. Head MRI did not show any acute findings, echocardiogram detailed below did not reveal obvious source of embolus, and carotid Doppler showed no obstructive ICA stenoses. No atrial arrhythmias were noted. Plavix was added to his regimen. He states that he has had neurology follow-up since then, and remains symptomatically stable.  From a cardiac perspective, he does not endorse any angina symptoms. Does state that he has had trouble with the high heat in the summer, not walking as regularly. Feels like his stamina is not quite what it used to be.  I reviewed his recent ECG, outlined below. We did discuss arranging a follow-up stress test to reassess ischemic burden, it has been several years. He plans to go to [place] in the next few weeks for an extended hunting trip, we will try to get this testing arranged before he leaves.   Past Medical History  Diagnosis Date  . Syncope   . Coronary atherosclerosis of native coronary artery     Multivessel status post CABG  . Essential hypertension, benign   . Hyperlipidemia   . Hypothyroidism     Past Surgical History  Procedure Laterality Date  . Coronary artery bypass graft  1996  . Mitral valve repair    . Laparoscopic appendectomy    . Skin cancer excision    . Hernia repair      Current Outpatient Prescriptions  Medication Sig Dispense Refill  .  ALPRAZolam (XANAX) 0.5 MG tablet Take 0.5 mg by mouth at bedtime as needed for sleep.     Marland Kitchen aspirin 81 MG tablet Take 1 tablet (81 mg total) by mouth daily.    . clopidogrel (PLAVIX) 75 MG tablet Take 1 tablet (75 mg total) by mouth daily. 30 tablet 0  . hydrochlorothiazide (HYDRODIURIL) 25 MG tablet Take 25 mg by mouth daily.     Marland Kitchen levothyroxine (SYNTHROID, LEVOTHROID) 75 MCG tablet Take 75 mcg by mouth daily.    . metoprolol succinate (TOPROL-XL) 50 MG 24 hr tablet Take 50 mg by mouth at bedtime. Take with or immediately following a meal.    . nitroGLYCERIN (NITROSTAT) 0.4 MG SL tablet Place 1 tablet (0.4 mg total) under the tongue as directed. (Patient taking differently: Place 0.4 mg under the tongue every 5 (five) minutes as needed for chest pain. ) 25 tablet 3  . rosuvastatin (CRESTOR) 40 MG tablet Take 20 mg by mouth daily.      No current facility-administered medications for this visit.    Allergies:  Review of patient's allergies indicates no known allergies.   Social History: The patient  reports that he has never smoked. He has never used smokeless tobacco. He reports that he drinks alcohol. He reports that he does not use illicit drugs.   ROS:  Please see the history of present illness. Otherwise, complete review of systems is positive for none.  All  other systems are reviewed and negative.   Physical Exam: VS:  BP 123/74 mmHg  Pulse 64  Ht 5\' 11"  (1.803 m)  Wt 203 lb 12.8 oz (92.443 kg)  BMI 28.44 kg/m2  SpO2 95%, BMI Body mass index is 28.44 kg/(m^2).  Wt Readings from Last 3 Encounters:  09/07/15 203 lb 12.8 oz (92.443 kg)  07/24/15 205 lb 7.5 oz (93.2 kg)  07/25/15 205 lb (92.987 kg)     Patient appears comfortable at rest. HEENT: Conjunctiva and lids normal, oropharynx clear. Neck: Supple, no elevated JVP or carotid bruits, no thyromegaly. Lungs: Clear to auscultation, nonlabored breathing at rest. Cardiac: Regular rate and rhythm, no S3 or significant systolic  murmur, no pericardial rub. Abdomen: Soft, nontender, bowel sounds present, no guarding or rebound. Extremities: No pitting edema, distal pulses 2+. Skin: Warm and dry. Musculoskeletal: No kyphosis. Neuropsychiatric: Alert and oriented x3, affect grossly appropriate.   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   Recent Labwork: 07/24/2015: ALT 27; AST 24; BUN 14; Creatinine, Ser 1.10; Hemoglobin 15.0; Platelets 146*; Potassium 3.9; Sodium 140     Component Value Date/Time   CHOL 128 07/25/2015 0604   TRIG 149 07/25/2015 0604   HDL 29* 07/25/2015 0604   CHOLHDL 4.4 07/25/2015 0604   VLDL 30 07/25/2015 0604   LDLCALC 69 07/25/2015 0604   LDLDIRECT 91.3 04/10/2007 0839    Other Studies Reviewed Today:  Echocardiogram 07/25/2015: Study Conclusions  - Left ventricle: There was mild concentric hypertrophy. Systolic function was normal. The estimated ejection fraction was in the range of 60% to 65%. Wall motion was normal; there were no regional wall motion abnormalities. Features are consistent with a pseudonormal left ventricular filling pattern, with concomitant abnormal relaxation and increased filling pressure (grade 2 diastolic dysfunction). Doppler parameters are consistent with high ventricular filling pressure. - Aortic valve: Mildly calcified annulus. Mildly thickened, mildly calcified leaflets. There was moderate regurgitation. - Mitral valve: Possible prior mitral ring annuloplasty with moderate stenosis. Moderately calcified annulus. Mildly thickened leaflets . There was mild regurgitation. Mean gradient (D): 8 mm Hg. Valve area by pressure half-time: 1.38 cm^2. - Left atrium: The atrium was moderately dilated. Volume/bsa, S: 35 ml/m^2. - Pulmonary arteries: PA peak pressure: 33 mm Hg (S).  Carotid Dopplers 07/25/2015: IMPRESSION: Plaque at the level of both carotid bulbs  and proximal internal carotid arteries, left greater than right. No significant stenosis is identified with estimated bilateral ICA stenoses of less than 50%.  Assessment and Plan:  1. Multivessel CAD status post CABG in 1996. As outlined above are plan will be a follow-up Lexiscan Cardiolite on medical therapy to reassess ischemic burden before he takes his extended hunting trip to [place]. He has not had ischemic follow-up testing in several years. Recent ECG and echocardiogram reviewed with stable results.  2. History of mitral annular repair, echocardiogram shows mild mitral regurgitation and stable mean gradient, mildly to moderately stenotic but asymptomatic.  3. Recent TIA based on record review, no symptoms at this time. He is now on aspirin and Plavix. No arrhythmias documented, no obstructive carotid artery disease by Dopplers.  4. Hyperlipidemia, on Crestor with recent LDL 69.  5. Essential hypertension, blood pressure normal today.  Current medicines were reviewed with the patient today.   Orders Placed This Encounter  Procedures  . NM Myocar Multi W/Spect W/Wall Motion / EF  . Myocardial Perfusion Imaging    Disposition: FU with me in  1 year.   Signed, Satira Sark, MD, [facility] 09/07/2015 10:55 AM    [facility] [place], [place], [place] [address] Phone: [phone]; Fax: [phone]

## 2015-09-09 ENCOUNTER — Inpatient Hospital Stay (HOSPITAL_COMMUNITY): Admission: RE | Admit: 2015-09-09 | Payer: Medicare Other | Source: Ambulatory Visit

## 2015-09-09 ENCOUNTER — Encounter (HOSPITAL_COMMUNITY)
Admission: RE | Admit: 2015-09-09 | Discharge: 2015-09-09 | Disposition: A | Discharge status: Home / Self Care | Payer: Medicare Other | Source: Ambulatory Visit | Attending: Cardiology | Admitting: Cardiology

## 2015-09-09 ENCOUNTER — Encounter: Payer: Medicare Other | Admitting: Gastroenterology

## 2015-09-09 ENCOUNTER — Encounter (HOSPITAL_COMMUNITY): Payer: Self-pay

## 2015-09-09 DIAGNOSIS — I2510 Atherosclerotic heart disease of native coronary artery without angina pectoris: Secondary | ICD-10-CM | POA: Diagnosis present

## 2015-09-09 LAB — NM MYOCAR MULTI W/SPECT W/WALL MOTION / EF
CHL CUP NUCLEAR SRS: 3
CHL CUP RESTING HR STRESS: 64 {beats}/min
LV dias vol: 95 mL
LV sys vol: 39 mL
NUC STRESS TID: 1.06
Peak HR: 80 {beats}/min
RATE: 0.05
SDS: 5
SSS: 8

## 2015-09-09 MED ORDER — TECHNETIUM TC 99M SESTAMIBI - CARDIOLITE
10.0000 | Freq: Once | INTRAVENOUS | Status: AC | PRN
  Administered 2015-09-09: 9 via INTRAVENOUS

## 2015-09-09 MED ORDER — TECHNETIUM TC 99M SESTAMIBI GENERIC - CARDIOLITE
30.0000 | Freq: Once | INTRAVENOUS | Status: AC | PRN
  Administered 2015-09-09: 28 via INTRAVENOUS

## 2015-09-09 MED ORDER — REGADENOSON 0.4 MG/5ML IV SOLN
INTRAVENOUS | Status: AC
  Administered 2015-09-09: 0.4 mg via INTRAVENOUS
  Filled 2015-09-09: qty 5

## 2015-09-09 MED ORDER — SODIUM CHLORIDE 0.9 % IJ SOLN
INTRAMUSCULAR | Status: AC
  Filled 2015-09-09: qty 36

## 2015-09-09 MED ORDER — SODIUM CHLORIDE 0.9 % IJ SOLN
INTRAMUSCULAR | Status: AC
  Administered 2015-09-09: 10 mL via INTRAVENOUS
  Filled 2015-09-09: qty 3

## 2015-09-15 ENCOUNTER — Telehealth: Payer: Self-pay

## 2015-09-15 ENCOUNTER — Observation Stay (HOSPITAL_COMMUNITY)
Admission: EM | Admit: 2015-09-15 | Discharge: 2015-09-16 | Disposition: A | Discharge status: Home / Self Care | Payer: Medicare Other | Attending: Emergency Medicine | Admitting: Emergency Medicine

## 2015-09-15 ENCOUNTER — Encounter (HOSPITAL_COMMUNITY): Payer: Self-pay

## 2015-09-15 ENCOUNTER — Emergency Department (HOSPITAL_COMMUNITY): Payer: Medicare Other

## 2015-09-15 DIAGNOSIS — E039 Hypothyroidism, unspecified: Secondary | ICD-10-CM | POA: Diagnosis present

## 2015-09-15 DIAGNOSIS — Z7902 Long term (current) use of antithrombotics/antiplatelets: Secondary | ICD-10-CM | POA: Insufficient documentation

## 2015-09-15 DIAGNOSIS — G459 Transient cerebral ischemic attack, unspecified: Secondary | ICD-10-CM | POA: Diagnosis present

## 2015-09-15 DIAGNOSIS — I2510 Atherosclerotic heart disease of native coronary artery without angina pectoris: Secondary | ICD-10-CM | POA: Diagnosis not present

## 2015-09-15 DIAGNOSIS — E785 Hyperlipidemia, unspecified: Secondary | ICD-10-CM | POA: Diagnosis not present

## 2015-09-15 DIAGNOSIS — Z85828 Personal history of other malignant neoplasm of skin: Secondary | ICD-10-CM | POA: Insufficient documentation

## 2015-09-15 DIAGNOSIS — Z951 Presence of aortocoronary bypass graft: Secondary | ICD-10-CM | POA: Insufficient documentation

## 2015-09-15 DIAGNOSIS — F411 Generalized anxiety disorder: Secondary | ICD-10-CM | POA: Diagnosis not present

## 2015-09-15 DIAGNOSIS — Z7982 Long term (current) use of aspirin: Secondary | ICD-10-CM | POA: Insufficient documentation

## 2015-09-15 DIAGNOSIS — I10 Essential (primary) hypertension: Secondary | ICD-10-CM | POA: Diagnosis not present

## 2015-09-15 DIAGNOSIS — R200 Anesthesia of skin: Secondary | ICD-10-CM | POA: Diagnosis not present

## 2015-09-15 DIAGNOSIS — Z79899 Other long term (current) drug therapy: Secondary | ICD-10-CM | POA: Insufficient documentation

## 2015-09-15 DIAGNOSIS — R51 Headache: Secondary | ICD-10-CM | POA: Diagnosis present

## 2015-09-15 LAB — I-STAT CHEM 8, ED
BUN: 22 mg/dL — ABNORMAL HIGH (ref 6–20)
CALCIUM ION: 1.18 mmol/L (ref 1.13–1.30)
CHLORIDE: 99 mmol/L — AB (ref 101–111)
Creatinine, Ser: 1.1 mg/dL (ref 0.61–1.24)
GLUCOSE: 108 mg/dL — AB (ref 65–99)
HCT: 49 % (ref 39.0–52.0)
Hemoglobin: 16.7 g/dL (ref 13.0–17.0)
Potassium: 3.6 mmol/L (ref 3.5–5.1)
SODIUM: 142 mmol/L (ref 135–145)
TCO2: 29 mmol/L (ref 0–100)

## 2015-09-15 LAB — CBC
HCT: 43.5 % (ref 39.0–52.0)
Hemoglobin: 14.3 g/dL (ref 13.0–17.0)
MCH: 29.5 pg (ref 26.0–34.0)
MCHC: 32.9 g/dL (ref 30.0–36.0)
MCV: 89.7 fL (ref 78.0–100.0)
Platelets: 155 10*3/uL (ref 150–400)
RBC: 4.85 MIL/uL (ref 4.22–5.81)
RDW: 12.9 % (ref 11.5–15.5)
WBC: 7.3 10*3/uL (ref 4.0–10.5)

## 2015-09-15 LAB — CREATININE, SERUM
CREATININE: 1.04 mg/dL (ref 0.61–1.24)
GFR calc Af Amer: >60 mL/min (ref >60)

## 2015-09-15 LAB — I-STAT TROPONIN, ED: Troponin i, poc: 0 ng/mL (ref 0.00–0.08)

## 2015-09-15 MED ORDER — SENNOSIDES-DOCUSATE SODIUM 8.6-50 MG PO TABS
1.0000 | ORAL_TABLET | Freq: Every evening | ORAL | Status: DC | PRN
Start: 2015-09-15 — End: 2015-09-16

## 2015-09-15 MED ORDER — LORAZEPAM 2 MG/ML IJ SOLN
1.0000 mg | Freq: Once | INTRAMUSCULAR | Status: AC
Start: 2015-09-15 — End: 2015-09-15
  Administered 2015-09-15: 1 mg via INTRAVENOUS
  Filled 2015-09-15: qty 1

## 2015-09-15 MED ORDER — ONDANSETRON HCL 4 MG PO TABS: 4.0000 mg | ORAL_TABLET | Freq: Four times a day (QID) | ORAL | Status: DC | PRN

## 2015-09-15 MED ORDER — ASPIRIN 81 MG PO CHEW
81.0000 mg | CHEWABLE_TABLET | Freq: Every day | ORAL | Status: DC
  Administered 2015-09-15 – 2015-09-16 (×2): 81 mg via ORAL
  Filled 2015-09-15 (×5): qty 1

## 2015-09-15 MED ORDER — LEVOTHYROXINE SODIUM 75 MCG PO TABS
75.0000 ug | ORAL_TABLET | Freq: Every day | ORAL | Status: DC
  Administered 2015-09-16: 75 ug via ORAL
  Filled 2015-09-15: qty 1

## 2015-09-15 MED ORDER — METOPROLOL SUCCINATE ER 50 MG PO TB24
50.0000 mg | ORAL_TABLET | Freq: Every day | ORAL | Status: DC
  Administered 2015-09-15: 50 mg via ORAL
  Filled 2015-09-15: qty 1

## 2015-09-15 MED ORDER — ACETAMINOPHEN 650 MG RE SUPP: 650.0000 mg | Freq: Four times a day (QID) | RECTAL | Status: DC | PRN

## 2015-09-15 MED ORDER — INFLUENZA VAC SPLIT QUAD 0.5 ML IM SUSY
0.5000 mL | PREFILLED_SYRINGE | INTRAMUSCULAR | Status: DC
  Filled 2015-09-15: qty 0.5

## 2015-09-15 MED ORDER — STROKE: EARLY STAGES OF RECOVERY BOOK
Freq: Once | Status: AC
  Administered 2015-09-15: 20:00:00
  Filled 2015-09-15: qty 1

## 2015-09-15 MED ORDER — HYDROCHLOROTHIAZIDE 25 MG PO TABS
25.0000 mg | ORAL_TABLET | Freq: Every day | ORAL | Status: DC
  Administered 2015-09-16: 25 mg via ORAL
  Filled 2015-09-15: qty 1

## 2015-09-15 MED ORDER — ALPRAZOLAM 0.5 MG PO TABS: 0.5000 mg | ORAL_TABLET | Freq: Every evening | ORAL | Status: DC | PRN

## 2015-09-15 MED ORDER — NITROGLYCERIN 0.4 MG SL SUBL: 0.4000 mg | SUBLINGUAL_TABLET | SUBLINGUAL | Status: DC | PRN

## 2015-09-15 MED ORDER — SODIUM CHLORIDE 0.9 % IJ SOLN
3.0000 mL | Freq: Two times a day (BID) | INTRAMUSCULAR | Status: DC
  Administered 2015-09-15 – 2015-09-16 (×2): 3 mL via INTRAVENOUS

## 2015-09-15 MED ORDER — CLOPIDOGREL BISULFATE 75 MG PO TABS
75.0000 mg | ORAL_TABLET | Freq: Every day | ORAL | Status: DC
  Administered 2015-09-16: 75 mg via ORAL
  Filled 2015-09-15: qty 1

## 2015-09-15 MED ORDER — ONDANSETRON HCL 4 MG/2ML IJ SOLN: 4.0000 mg | Freq: Four times a day (QID) | INTRAMUSCULAR | Status: DC | PRN

## 2015-09-15 MED ORDER — ACETAMINOPHEN 325 MG PO TABS: 650.0000 mg | ORAL_TABLET | Freq: Four times a day (QID) | ORAL | Status: DC | PRN

## 2015-09-15 MED ORDER — ENSURE ENLIVE PO LIQD
237.0000 mL | Freq: Two times a day (BID) | ORAL | Status: DC
  Administered 2015-09-16: 237 mL via ORAL

## 2015-09-15 MED ORDER — ENOXAPARIN SODIUM 40 MG/0.4ML ~~LOC~~ SOLN
40.0000 mg | SUBCUTANEOUS | Status: DC
  Administered 2015-09-15: 40 mg via SUBCUTANEOUS
  Filled 2015-09-15: qty 0.4

## 2015-09-15 MED ORDER — ROSUVASTATIN CALCIUM 20 MG PO TABS
20.0000 mg | ORAL_TABLET | Freq: Every day | ORAL | Status: DC
  Administered 2015-09-16: 20 mg via ORAL
  Filled 2015-09-15: qty 1

## 2015-09-15 NOTE — Telephone Encounter (Signed)
Pt seen in August for TIA, now has headache, weakness,bi-lat arm numbness and left sided facial numbness wich lasted approximately 5-10 min. I advised patient to go direct to the ED.He will have someone drive him

## 2015-09-15 NOTE — ED Provider Notes (Signed)
CSN: [id number]     Arrival date & time 09/15/15  1317 History   First MD Initiated Contact with Patient 09/15/15 1331     Chief Complaint  Patient presents with  . extremity weakness   . Headache     (Consider location/radiation/quality/duration/timing/severity/associated sxs/prior Treatment) HPI  69 year old male with past medical history of coronary artery disease status post CABG, hyperlipidemia, syncope, melanoma status post excision presents to the emergency department today with facial numbness. Patient states that he was exercising his coon dogs but not exerting himself more than normal, then went to climb up a ladder when he got back down he had a left arm pain that radiated down to his left hand and the start having some left facial numbness, tingling and paresthesias. This spread to be perioral but not on the rest of his face. Started having some dyspnea and lightheadedness during this time as well so he came to the ED for further evaluation.  Past Medical History  Diagnosis Date  . Syncope   . Coronary atherosclerosis of native coronary artery     Multivessel status post CABG  . Essential hypertension, benign   . Hyperlipidemia   . Hypothyroidism   . Cancer     melanoma on nose   Past Surgical History  Procedure Laterality Date  . Coronary artery bypass graft  1996  . Mitral valve repair    . Laparoscopic appendectomy    . Skin cancer excision    . Hernia repair     Family History  Problem Relation Age of Onset  . Heart attack Brother    Social History  Substance Use Topics  . Smoking status: Never Smoker   . Smokeless tobacco: Never Used  . Alcohol Use: 0.0 oz/week    0 Standard drinks or equivalent per week     Comment: occ    Review of Systems  Constitutional: Negative for fever, chills, activity change and appetite change.  HENT: Negative for congestion.   Eyes: Negative for pain.  Cardiovascular: Negative for chest pain.  Gastrointestinal: Negative  for nausea.  Endocrine: Negative for polydipsia and polyuria.  Genitourinary: Negative for dysuria and difficulty urinating.  Neurological: Positive for numbness. Negative for weakness.  All other systems reviewed and are negative.     Allergies  Review of patient's allergies indicates no known allergies.  Home Medications   Prior to Admission medications   Medication Sig Start Date End Date Taking? Authorizing Provider  ALPRAZolam Duanne Moron) 0.5 MG tablet Take 0.5 mg by mouth at bedtime as needed for sleep.    Yes Historical Provider, MD  aspirin 81 MG tablet Take 1 tablet (81 mg total) by mouth daily. 07/25/15  Yes Samuella Cota, MD  clopidogrel (PLAVIX) 75 MG tablet Take 1 tablet (75 mg total) by mouth daily. 07/25/15  Yes Samuella Cota, MD  hydrochlorothiazide (HYDRODIURIL) 25 MG tablet Take 25 mg by mouth daily.  08/10/15 08/09/16 Yes Historical Provider, MD  levothyroxine (SYNTHROID, LEVOTHROID) 75 MCG tablet Take 75 mcg by mouth daily.   Yes Historical Provider, MD  metoprolol succinate (TOPROL-XL) 50 MG 24 hr tablet Take 50 mg by mouth at bedtime. Take with or immediately following a meal.   Yes Historical Provider, MD  nitroGLYCERIN (NITROSTAT) 0.4 MG SL tablet Place 1 tablet (0.4 mg total) under the tongue as directed. Patient taking differently: Place 0.4 mg under the tongue every 5 (five) minutes as needed for chest pain.  08/05/12  Yes Thomas C Wall,  MD  rosuvastatin (CRESTOR) 40 MG tablet Take 20 mg by mouth daily.    Yes Historical Provider, MD   BP 154/84 mmHg  Pulse 77  Temp(Src) 98 F (36.7 C) (Oral)  Resp 20  Ht 5\' 11"  (1.803 m)  Wt 203 lb (92.08 kg)  BMI 28.33 kg/m2  SpO2 97% Physical Exam  Constitutional: He is oriented to person, place, and time. He appears well-developed and well-nourished.  HENT:  Head: Normocephalic and atraumatic.  Eyes: Conjunctivae and EOM are normal.  Neck: Normal range of motion. Neck supple.  Cardiovascular: Normal rate and  regular rhythm.   Pulmonary/Chest: Effort normal. No respiratory distress.  Abdominal: Soft. There is no tenderness.  Musculoskeletal: Normal range of motion. He exhibits no edema or tenderness.  Neurological: He is alert and oriented to person, place, and time.  Skin: Skin is warm and dry.  Nursing note and vitals reviewed.   ED Course  Procedures (including critical care time) Labs Review Labs Reviewed  I-STAT CHEM 8, ED - Abnormal; Notable for the following:    Chloride 99 (*)    BUN 22 (*)    Glucose, Bld 108 (*)    All other components within normal limits  I-STAT TROPOININ, ED    Imaging Review Mr Brain Wo Contrast  09/15/2015   CLINICAL DATA:  Acute onset of squeezing sensation in the distal left humerus and tingling in the left forearm. Possible stroke.  EXAM: MRI HEAD WITHOUT CONTRAST  TECHNIQUE: Multiplanar, multiecho pulse sequences of the brain and surrounding structures were obtained without intravenous contrast.  COMPARISON:  07/25/2015  FINDINGS: Partially empty sella is unchanged. There is no evidence of acute infarct, intracranial hemorrhage, mass, midline shift, or extra-axial fluid collection. There is mild generalized cerebral atrophy, within normal limits for age. There is no significant cerebral white matter disease for age. A chronic right cerebellar infarct is again seen.  Suspected old left orbital floor blowout fracture. Globes appear intact. No significant inflammatory changes are seen in the paranasal sinuses or mastoid air cells. Major intracranial vascular flow voids are preserved.  IMPRESSION: 1. No acute intracranial abnormality. 2. Chronic right cerebellar infarct.   Electronically Signed   By: Logan Bores M.D.   On: 09/15/2015 14:51   I have personally reviewed and evaluated these images and lab results as part of my medical decision-making.   EKG Interpretation   Date/Time:  Thursday September 15 2015 13:26:44 EDT Ventricular Rate:  80 PR Interval:   155 QRS Duration: 139 QT Interval:  410 QTC Calculation: 473 R Axis:   101 Text Interpretation:  Sinus rhythm Ventricular trigeminy Probable left  atrial enlargement RBBB and LPFB Probable anterolateral infarct, old Sinus  rhythm Non-specific intra-ventricular conduction delay Artifact No  significant change since last tracing Abnormal ekg Confirmed by Carmin Muskrat  MD [phone]) on 09/15/2015 1:32:28 PM      MDM   Final diagnoses:  Facial numbness   Several possible arrhythmia versus TIA discussed with neurology and cardiology will do recommend inpatient telemetry observation. MRI done which do not show any new ischemia. We'll admit to medicine for observation.      Merrily Pew, MD 09/15/15 [phone]

## 2015-09-15 NOTE — H&P (Signed)
[facility] History and Physical  Matthew Williams QIH:[id number] DOB: 11/16/1946 DOA: 09/15/2015  Referring physician: Dr. Dayna Barker - AP PCP: Curly Rim, MD   Chief Complaint: L sided weakness and HA  HPI: Matthew Williams is a 69 y.o. male  At approximately 12:00 patient experienced left arm numbness and weakness from the shoulder to the hand associated with left-sided headache and left facial lip tingling. 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 Denies any lower extremity weakness. Patient also describes a feeling of being woozy. Denies any LOC, palpitations, chest pain, shortness of breath, nausea, vomiting, fevers, diaphoresis. Symptoms were constant and worsened until arriving in the ED when they began to resolve. Patient is currently asymptomatic.  Patient has had TIAs in the past and states that these symptoms are similar in nature.  Review of Systems:  Constitutional:  No weight loss, night sweats, Fevers, chills, fatigue.  HEENT:  No headaches, Difficulty swallowing,Tooth/dental problems,Sore throat,  No sneezing, itching, ear ache, nasal congestion, post nasal drip,  Cardio-vascular:  No chest pain, Orthopnea, PND, swelling in lower extremities, anasarca, dizziness, palpitations  GI:  No heartburn, indigestion, abdominal pain, nausea, vomiting, diarrhea, change in bowel habits, loss of appetite  Resp:   No shortness of breath with exertion or at rest. No excess mucus, no productive cough, No non-productive cough, No coughing up of blood.No change in color of mucus.No wheezing.No chest wall deformity  Skin:  no rash or lesions.  GU:  no dysuria, change in color of urine, no urgency or frequency. No flank pain.  Musculoskeletal:  Per HPi Psych:  No change in mood or affect. No depression or anxiety. No memory loss.   Past Medical History  Diagnosis Date  . Syncope   .  Coronary atherosclerosis of native coronary artery     Multivessel status post CABG  . Essential hypertension, benign   . Hyperlipidemia   . Hypothyroidism   . Cancer     melanoma on nose   Past Surgical History  Procedure Laterality Date  . Coronary artery bypass graft  1996  . Mitral valve repair    . Laparoscopic appendectomy    . Skin cancer excision    . Hernia repair     Social History:  reports that he has never smoked. He has never used smokeless tobacco. He reports that he drinks alcohol. He reports that he does not use illicit drugs.  No Known Allergies  Family History  Problem Relation Age of Onset  . Heart attack Brother      Prior to Admission medications   Medication Sig Start Date End Date Taking? Authorizing Provider  ALPRAZolam Duanne Moron) 0.5 MG tablet Take 0.5 mg by mouth at bedtime as needed for sleep.    Yes Historical Provider, MD  aspirin 81 MG tablet Take 1 tablet (81 mg total) by mouth daily. 07/25/15  Yes Samuella Cota, MD  clopidogrel (PLAVIX) 75 MG tablet Take 1 tablet (75 mg total) by mouth daily. 07/25/15  Yes Samuella Cota, MD  hydrochlorothiazide (HYDRODIURIL) 25 MG tablet Take 25 mg by mouth daily.  08/10/15 08/09/16 Yes Historical Provider, MD  levothyroxine (SYNTHROID, LEVOTHROID) 75 MCG tablet Take 75 mcg by mouth daily.   Yes Historical Provider, MD  metoprolol succinate (TOPROL-XL) 50 MG 24 hr tablet Take 50 mg by mouth at bedtime. Take with or immediately following a meal.   Yes  Historical Provider, MD  nitroGLYCERIN (NITROSTAT) 0.4 MG SL tablet Place 1 tablet (0.4 mg total) under the tongue as directed. Patient taking differently: Place 0.4 mg under the tongue every 5 (five) minutes as needed for chest pain.  08/05/12  Yes Renella Cunas, MD  rosuvastatin (CRESTOR) 40 MG tablet Take 20 mg by mouth daily.    Yes Historical Provider, MD   Physical Exam: Filed Vitals:   09/15/15 1545 09/15/15 1600 09/15/15 1610 09/15/15 1615  BP:      Pulse:  76 73 74 75  Temp:      TempSrc:      Resp: 20 17 17 18   Height:      Weight:      SpO2: 98% 100% 100% 98%    Wt Readings from Last 3 Encounters:  09/15/15 92.08 kg (203 lb)  09/15/15 92.08 kg (203 lb)  09/07/15 92.443 kg (203 lb 12.8 oz)    General:  Appears calm and comfortable Eyes:  PERRL, normal lids, irises & conjunctiva ENT:  grossly normal hearing, lips & tongue Neck:  no LAD, masses or thyromegaly Cardiovascular:  RRR, II/VI systolic murmur. No LE edema. Telemetry:  SR, no arrhythmias  Respiratory:  CTA bilaterally, no w/r/r. Normal respiratory effort. Abdomen:  soft, ntnd Skin:  no rash or induration seen on limited exam Musculoskeletal:  grossly normal tone BUE/BLE Psychiatric:  grossly normal mood and affect, speech fluent and appropriate Neurologic: Cranial nerves II through XII intact, no dysmetria, patient able to sit unassisted, moves all extremity coordinated fashion, patient initially with 4-5 grip strength on the left but after multiple attempts grip strength improved and was 5 out of 5 bilaterally.           Labs on Admission:  Basic Metabolic Panel:  Recent Labs Lab 09/15/15 1330  NA 142  K 3.6  CL 99*  GLUCOSE 108*  BUN 22*  CREATININE 1.10   Liver Function Tests: No results for input(s): AST, ALT, ALKPHOS, BILITOT, PROT, ALBUMIN in the last 168 hours. No results for input(s): LIPASE, AMYLASE in the last 168 hours. No results for input(s): AMMONIA in the last 168 hours. CBC:  Recent Labs Lab 09/15/15 1330  HGB 16.7  HCT 49.0   Cardiac Enzymes: No results for input(s): CKTOTAL, CKMB, CKMBINDEX, TROPONINI in the last 168 hours.  BNP (last 3 results) No results for input(s): BNP in the last 8760 hours.  ProBNP (last 3 results) No results for input(s): PROBNP in the last 8760 hours.  CBG: No results for input(s): GLUCAP in the last 168 hours.  Radiological Exams on Admission: Mr Brain Wo Contrast  09/15/2015   CLINICAL DATA:   Acute onset of squeezing sensation in the distal left humerus and tingling in the left forearm. Possible stroke.  EXAM: MRI HEAD WITHOUT CONTRAST  TECHNIQUE: Multiplanar, multiecho pulse sequences of the brain and surrounding structures were obtained without intravenous contrast.  COMPARISON:  07/25/2015  FINDINGS: Partially empty sella is unchanged. There is no evidence of acute infarct, intracranial hemorrhage, mass, midline shift, or extra-axial fluid collection. There is mild generalized cerebral atrophy, within normal limits for age. There is no significant cerebral white matter disease for age. A chronic right cerebellar infarct is again seen.  Suspected old left orbital floor blowout fracture. Globes appear intact. No significant inflammatory changes are seen in the paranasal sinuses or mastoid air cells. Major intracranial vascular flow voids are preserved.  IMPRESSION: 1. No acute intracranial abnormality. 2. Chronic right cerebellar  infarct.   Electronically Signed   By: Logan Bores M.D.   On: 09/15/2015 14:51      Assessment/Plan Active Problems:   Coronary atherosclerosis of native coronary artery   Essential hypertension, benign   Hypothyroidism   TIA (transient ischemic attack)   Facial numbness   HLD (hyperlipidemia)   Anxiety state   TIA: Patient likely suffered a TIA given description of symptoms including numbness, weakness, and headache. MRI without new acute process but chronic right cerebral infarct appreciated. Patient currently asymptomatic. TIA ABCD2 risk score 6. Case discussed by ED physician w/ Cards and Neuro who feel pt warrants obs admission. No formal consult  - Tele - neuro checks - RN swallow  - PT/OT - cont home ASA and crestor  HTN: - continue HCTZ and metop  CAD s/p CABG: - continue ASA, plavix, Crestor  Hypothyroid - continue synthroid   HLD - continue crestor  Anxiety - continue Xanax    Code Status: FULL DVT Prophylaxis:  Lovenox Family Communication: Friend "[facility]." Disposition Plan: Pending r/o  MERRELL, DAVID J, MD Family Medicine [facility] [email or web address] Password [email or web address]

## 2015-09-15 NOTE — ED Notes (Signed)
[place] for an hour

## 2015-09-15 NOTE — ED Notes (Signed)
Pt reports numbness and tingling to left side of face and left arm x 1 hour.  Reports was out walking his dog.  Pt also says "my  Mouth was twisted up."  Reports was here approx 1 month ago with same symptoms and was told he had  A "light stroke."

## 2015-09-16 ENCOUNTER — Other Ambulatory Visit (HOSPITAL_COMMUNITY): Payer: Medicare Other

## 2015-09-16 DIAGNOSIS — E785 Hyperlipidemia, unspecified: Secondary | ICD-10-CM

## 2015-09-16 DIAGNOSIS — R208 Other disturbances of skin sensation: Secondary | ICD-10-CM

## 2015-09-16 DIAGNOSIS — F411 Generalized anxiety disorder: Secondary | ICD-10-CM

## 2015-09-16 DIAGNOSIS — G459 Transient cerebral ischemic attack, unspecified: Secondary | ICD-10-CM

## 2015-09-16 DIAGNOSIS — I10 Essential (primary) hypertension: Secondary | ICD-10-CM | POA: Diagnosis not present

## 2015-09-16 LAB — LIPID PANEL
CHOL/HDL RATIO: 7.2 ratio
CHOLESTEROL: 187 mg/dL (ref 0–200)
HDL: 26 mg/dL — AB (ref >40)
LDL Cholesterol: 122 mg/dL — ABNORMAL HIGH (ref 0–99)
Triglycerides: 195 mg/dL — ABNORMAL HIGH (ref <150)
VLDL: 39 mg/dL (ref 0–40)

## 2015-09-16 LAB — TSH: TSH: 0.966 u[IU]/mL (ref 0.350–4.500)

## 2015-09-16 NOTE — Progress Notes (Signed)
PT Cancellation Note  Patient Details Name: Matthew Williams MRN: [id number] DOB: [birth year]   Cancelled Treatment:    Reason Eval/Treat Not Completed: PT screened, no needs identified, will sign off   Sable Feil  PT 09/16/2015, 9:32 AM [phone]

## 2015-09-16 NOTE — Discharge Summary (Signed)
Physician Discharge Summary  Matthew Williams IWL:[id number] DOB: 16-Nov-1946 DOA: 09/15/2015  PCP: Curly Rim, MD  Admit date: 09/15/2015 Discharge date: 09/16/2015  Time spent: 35 minutes  Recommendations for Outpatient Follow-up:  1. Follow up with your PCP next week. 2. Follow up with Dr Sampson Goon as scheduled.   Discharge Diagnoses:  Active Problems:   Coronary atherosclerosis of native coronary artery   Essential hypertension, benign   Hypothyroidism   TIA (transient ischemic attack)   Facial numbness   HLD (hyperlipidemia)   Anxiety state   Discharge Condition:  Improved.   Diet recommendation: cardiac diet.   Filed Weights   09/15/15 1326  Weight: 92.08 kg (203 lb)    History of present illness: patient was admitted for TIA by Dr Marily Memos on Sept 29, 2016.  As per his H and P:  " At approximately 12:00 patient experienced left arm numbness and weakness from the shoulder to the hand associated with left-sided headache and left facial lip tingling. 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 Denies any lower extremity weakness. Patient also describes a feeling of being woozy. Denies any LOC, palpitations, chest pain, shortness of breath, nausea, vomiting, fevers, diaphoresis. Symptoms were constant and worsened until arriving in the ED when they began to resolve. Patient is currently asymptomatic.  Patient has had TIAs in the past and states that these symptoms are similar in nature.   Hospital Course:  Patient was admitted into the hospital, and he had no further TIA symptomology.  He has been on both ASA 81mg  along with Plavix, and has been very compliant with them.  His Carotid US in August 2016 showed plaques, but no critical lesions.  He also had an MRI of his brain, which did not show any new infarct.  He does have prior cerebellar CVAs.  He was maintained on Crestor, and will be  continued on DAPT upon discharge.  He will see his PCP next week, and follow up with Dr Sampson Goon as previously scheduled.  He is anxious to go home, and is stable for discharge.  Thank you and Good Day.   Discharge Exam: Filed Vitals:   09/16/15 0836  BP: 126/76  Pulse: 66  Temp: 97.7 F (36.5 C)  Resp: 20    Discharge Instructions:  Follow up with PCP next week, and with Dr Merlene Laughter as scheduled.     Discharge Instructions    Diet - low sodium heart healthy    Complete by:  As directed      Discharge instructions    Complete by:  As directed   Follow up with Dr Merlene Laughter as scheduled.  Take your medications as directed.     Increase activity slowly    Complete by:  As directed           Current Discharge Medication List    CONTINUE these medications which have NOT CHANGED   Details  ALPRAZolam (XANAX) 0.5 MG tablet Take 0.5 mg by mouth at bedtime as needed for sleep.     aspirin 81 MG tablet Take 1 tablet (81 mg total) by mouth daily.    clopidogrel (PLAVIX) 75 MG tablet Take 1 tablet (75 mg total) by mouth daily. Qty: 30 tablet, Refills: 0    hydrochlorothiazide (HYDRODIURIL) 25 MG tablet Take 25 mg by mouth daily.     levothyroxine (SYNTHROID, LEVOTHROID) 75 MCG tablet Take 75 mcg by  mouth daily.    metoprolol succinate (TOPROL-XL) 50 MG 24 hr tablet Take 50 mg by mouth at bedtime. Take with or immediately following a meal.    nitroGLYCERIN (NITROSTAT) 0.4 MG SL tablet Place 1 tablet (0.4 mg total) under the tongue as directed. Qty: 25 tablet, Refills: 3   Associated Diagnoses: Coronary atherosclerosis of unspecified type of vessel, native or graft    rosuvastatin (CRESTOR) 40 MG tablet Take 20 mg by mouth daily.        No Known Allergies    The results of significant diagnostics from this hospitalization (including imaging, microbiology, ancillary and laboratory) are listed below for reference.    Significant Diagnostic Studies: Mr Brain Wo  Contrast  09/15/2015   CLINICAL DATA:  Acute onset of squeezing sensation in the distal left humerus and tingling in the left forearm. Possible stroke.  EXAM: MRI HEAD WITHOUT CONTRAST  TECHNIQUE: Multiplanar, multiecho pulse sequences of the brain and surrounding structures were obtained without intravenous contrast.  COMPARISON:  07/25/2015  FINDINGS: Partially empty sella is unchanged. There is no evidence of acute infarct, intracranial hemorrhage, mass, midline shift, or extra-axial fluid collection. There is mild generalized cerebral atrophy, within normal limits for age. There is no significant cerebral white matter disease for age. A chronic right cerebellar infarct is again seen.  Suspected old left orbital floor blowout fracture. Globes appear intact. No significant inflammatory changes are seen in the paranasal sinuses or mastoid air cells. Major intracranial vascular flow voids are preserved.  IMPRESSION: 1. No acute intracranial abnormality. 2. Chronic right cerebellar infarct.   Electronically Signed   By: Logan Bores M.D.   On: 09/15/2015 14:51   Nm Myocar Multi W/spect W/wall Motion / Ef  09/09/2015    There was no ST segment deviation noted during stress.  Findings consistent with prior inferior myocardial infarction with mild  to moderate peri-infarct ischemia.  Nuclear stress EF: 59%.  Overall low to intermediate risk study     Microbiology: No results found for this or any previous visit (from the past 240 hour(s)).   Labs: Basic Metabolic Panel:  Recent Labs Lab 09/15/15 1330 09/15/15 1654  NA 142  --   K 3.6  --   CL 99*  --   GLUCOSE 108*  --   BUN 22*  --   CREATININE 1.10 1.04   Liver Function Tests: No results for input(s): AST, ALT, ALKPHOS, BILITOT, PROT, ALBUMIN in the last 168 hours. No results for input(s): LIPASE, AMYLASE in the last 168 hours. No results for input(s): AMMONIA in the last 168 hours. CBC:  Recent Labs Lab 09/15/15 1330 09/15/15 1654   WBC  --  7.3  HGB 16.7 14.3  HCT 49.0 43.5  MCV  --  89.7  PLT  --  155    Signed:  Arionna Hoggard  [facility] 09/16/2015, 10:21 AM

## 2015-09-16 NOTE — Consult Note (Signed)
Trego A. Merlene Laughter, MD     [email or web address]          Matthew Williams is an 69 y.o. male.   ASSESSMENT/PLAN: 1. Recurrent neurological spells of unclear etiology. The differential diagnosis includes complex partial seizure, TIA and complex migraine.   RECOMMENDATION: The patient previously was placed on dual antiplatelet agents. He should consider this for the next 2 months. EEG.  Again the patient presents with recurrent neurological episode. The patient had an initial event few weeks ago and overall may have had a few events. This event was associated with dizziness described as disequilibrium and some spinning. He reports bifrontal temporal headaches gait imbalance and numbness involving the left upper extremity. The numbness is described mostly as a Marketing executive easier. Also had periorbital numbness on both sides more on the right side. The event lasted for about 15 minutes. The patient was used to worked up for similar event a few weeks ago and the workup was essentially unremarkable. The chart reveals that he has some chest pain but on my questioning, the patient denies any chest pain or shortness of breath. There is no loss of consciousness. There is no tonic-clonic activity, seizures or loss of consciousness. The patient has had some chronic gait problems which is episodic but started about 2 years ago and has persisted. The review of systems otherwise negative.   GENERAL: This is a pleasant man in no acute distress.  HEENT: Supple. Atraumatic normocephalic.   ABDOMEN: soft  EXTREMITIES: No edema   BACK: Normal.  SKIN: Normal by inspection.    MENTAL STATUS: Alert and oriented. Speech, language and cognition are generally intact. Judgment and insight normal.   CRANIAL NERVES: Pupils are equal, round and reactive to light and accommodation; extra ocular movements are full, there is no significant nystagmus; visual fields are full; upper and lower facial  muscles are normal in strength and symmetric, there is no flattening of the nasolabial folds; tongue is midline; uvula is midline; shoulder elevation is normal.  MOTOR: Normal tone, bulk and strength; no pronator drift.  COORDINATION: Left finger to nose is normal, right finger to nose is normal, No rest tremor; no intention tremor; no postural tremor; no bradykinesia.  REFLEXES: Deep tendon reflexes are symmetrical and normal. Babinski reflexes are flexor bilaterally.   SENSATION: Normal to light touch.  GAIT: Patient ambulates without assisted devices. Gait is relatively stable.   Blood pressure 118/71, pulse 59, temperature 97.5 F (36.4 C), temperature source Oral, resp. rate 20, height 5' 11" (1.803 m), weight 92.08 kg (203 lb), SpO2 98 %.  Past Medical History  Diagnosis Date  . Syncope   . Coronary atherosclerosis of native coronary artery     Multivessel status post CABG  . Essential hypertension, benign   . Hyperlipidemia   . Hypothyroidism   . Cancer     melanoma on nose    Past Surgical History  Procedure Laterality Date  . Coronary artery bypass graft  1996  . Mitral valve repair    . Laparoscopic appendectomy    . Skin cancer excision    . Hernia repair      Family History  Problem Relation Age of Onset  . Heart attack Brother     Social History:  reports that he has never smoked. He has never used smokeless tobacco. He reports that he drinks alcohol. He reports that he does not use illicit drugs.  Allergies: No Known Allergies  Medications: Prior to Admission  medications   Medication Sig Start Date End Date Taking? Authorizing Provider  ALPRAZolam Duanne Moron) 0.5 MG tablet Take 0.5 mg by mouth at bedtime as needed for sleep.    Yes Historical Provider, MD  aspirin 81 MG tablet Take 1 tablet (81 mg total) by mouth daily. 07/25/15  Yes Samuella Cota, MD  clopidogrel (PLAVIX) 75 MG tablet Take 1 tablet (75 mg total) by mouth daily. 07/25/15  Yes Samuella Cota, MD  hydrochlorothiazide (HYDRODIURIL) 25 MG tablet Take 25 mg by mouth daily.  08/10/15 08/09/16 Yes Historical Provider, MD  levothyroxine (SYNTHROID, LEVOTHROID) 75 MCG tablet Take 75 mcg by mouth daily.   Yes Historical Provider, MD  metoprolol succinate (TOPROL-XL) 50 MG 24 hr tablet Take 50 mg by mouth at bedtime. Take with or immediately following a meal.   Yes Historical Provider, MD  nitroGLYCERIN (NITROSTAT) 0.4 MG SL tablet Place 1 tablet (0.4 mg total) under the tongue as directed. Patient taking differently: Place 0.4 mg under the tongue every 5 (five) minutes as needed for chest pain.  08/05/12  Yes Renella Cunas, MD  rosuvastatin (CRESTOR) 40 MG tablet Take 20 mg by mouth daily.    Yes Historical Provider, MD    Scheduled Meds: . aspirin  81 mg Oral Daily  . clopidogrel  75 mg Oral Daily  . enoxaparin (LOVENOX) injection  40 mg Subcutaneous Q24H  . feeding supplement (ENSURE ENLIVE)  237 mL Oral BID BM  . hydrochlorothiazide  25 mg Oral Daily  . Influenza vac split quadrivalent PF  0.5 mL Intramuscular Tomorrow-1000  . levothyroxine  75 mcg Oral QAC breakfast  . metoprolol succinate  50 mg Oral QHS  . rosuvastatin  20 mg Oral Daily  . sodium chloride  3 mL Intravenous Q12H   Continuous Infusions:  PRN Meds:.acetaminophen **OR** acetaminophen, ALPRAZolam, nitroGLYCERIN, ondansetron **OR** ondansetron (ZOFRAN) IV, senna-docusate     Results for orders placed or performed during the hospital encounter of 09/15/15 (from the past 48 hour(s))  I-stat troponin, ED     Status: None   Collection Time: 09/15/15  1:29 PM  Result Value Ref Range   Troponin i, poc 0.00 0.00 - 0.08 ng/mL   Comment 3            Comment: Due to the release kinetics of cTnI, a negative result within the first hours of the onset of symptoms does not rule out myocardial infarction with certainty. If myocardial infarction is still suspected, repeat the test at appropriate intervals.   I-stat  chem 8, ed     Status: Abnormal   Collection Time: 09/15/15  1:30 PM  Result Value Ref Range   Sodium 142 135 - 145 mmol/L   Potassium 3.6 3.5 - 5.1 mmol/L   Chloride 99 (L) 101 - 111 mmol/L   BUN 22 (H) 6 - 20 mg/dL   Creatinine, Ser 1.10 0.61 - 1.24 mg/dL   Glucose, Bld 108 (H) 65 - 99 mg/dL   Calcium, Ion 1.18 1.13 - 1.30 mmol/L   TCO2 29 0 - 100 mmol/L   Hemoglobin 16.7 13.0 - 17.0 g/dL   HCT 49.0 39.0 - 52.0 %  CBC     Status: None   Collection Time: 09/15/15  4:54 PM  Result Value Ref Range   WBC 7.3 4.0 - 10.5 K/uL   RBC 4.85 4.22 - 5.81 MIL/uL   Hemoglobin 14.3 13.0 - 17.0 g/dL   HCT 43.5 39.0 - 52.0 %  MCV 89.7 78.0 - 100.0 fL   MCH 29.5 26.0 - 34.0 pg   MCHC 32.9 30.0 - 36.0 g/dL   RDW 12.9 11.5 - 15.5 %   Platelets 155 150 - 400 K/uL  Creatinine, serum     Status: None   Collection Time: 09/15/15  4:54 PM  Result Value Ref Range   Creatinine, Ser 1.04 0.61 - 1.24 mg/dL   GFR calc non Af Amer >60 >60 mL/min   GFR calc Af Amer >60 >60 mL/min    Comment: (NOTE) The eGFR has been calculated using the CKD EPI equation. This calculation has not been validated in all clinical situations. eGFR's persistently <60 mL/min signify possible Chronic Kidney Disease.   Lipid panel     Status: Abnormal   Collection Time: 09/16/15  5:35 AM  Result Value Ref Range   Cholesterol 187 0 - 200 mg/dL   Triglycerides 195 (H) <150 mg/dL   HDL 26 (L) >40 mg/dL   Total CHOL/HDL Ratio 7.2 RATIO   VLDL 39 0 - 40 mg/dL   LDL Cholesterol 122 (H) 0 - 99 mg/dL    Comment:        Total Cholesterol/HDL:CHD Risk Coronary Heart Disease Risk Table                     Men   Women  1/2 Average Risk   3.4   3.3  Average Risk       5.0   4.4  2 X Average Risk   9.6   7.1  3 X Average Risk  23.4   11.0        Use the calculated Patient Ratio above and the CHD Risk Table to determine the patient's CHD Risk.        ATP III CLASSIFICATION (LDL):  <100     mg/dL   Optimal  100-129  mg/dL    Near or Above                    Optimal  130-159  mg/dL   Borderline  160-189  mg/dL   High  >190     mg/dL   Very High     Studies/Results:  The brain MRI is reviewed in person. No acute lesions are seen on diffusion imaging. No evidence of mass lesions or hydrocephalus. 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 No microhemorrhages are appreciated. There is moderate global atrophy. There are no significant white matter ischemic changes.   Kofi A. Merlene Laughter, M.D.  Diplomate, Tax adviser of Psychiatry and Neurology ( Neurology). 09/16/2015, 8:12 AM

## 2015-09-16 NOTE — Progress Notes (Signed)
Initial Nutrition Assessment  DOCUMENTATION CODES:      INTERVENTION:  - Provided diet education review on heart healthy diet - including low Na, healthy eating and cooking information.     NUTRITION DIAGNOSIS:   Limited adherence to nutrition-related recommendations related to chronic illness as evidenced by per patient/family report.    GOAL:   Patient will meet greater than or equal to 90% of their needs    MONITOR:   PO intake, Labs, Weight trends  REASON FOR ASSESSMENT:   Malnutrition Screening Tool    ASSESSMENT:   69 y.o. male presented for facial numbness, left sided numbness / weakness. Patient has previous history of syncope, coronary atherosclerosis of native coronary artery, HTN, hypothyroidism, cancer, hyperlipidemia.   Patient standing in room at time of visit, preparing for discharge later in the day. He had a very positive affect and conversational. His BMI is 28.4, overweight. Patient's appetite has been good, with 100% meal completion.  Patient described his usual diet as one that is high in meats and vegetables. Patient likes to hunt and fish, and diet includes lots of meats such as beef, steak, chicken, game birds (including: quail, phesent, partridges), and fish (catfish, and other local fish). Patient enjoys eggs and bacon. Patient loves vegetables and frequently eats sweet potatoes, corn, squash, greens and legumes (pinto beans). He generally cooks most of his meals, or occasionally goes out to eat or eats with friends. When cooking at home, the patient does cook a lot of fried foods, and uses minimal salt. When eating out, the patient tries to choose an option with vegetables, but may choose "bad foods" sometimes.   Patient had previously tried to reduce intake and was watching what he was eating. He described intentional weight loss previously, weight currently appears stable at his usual body weight.   Patient has previously been given healthy heart  diet information following previous cardiac events. He describes being given a "book to read and being preached at." However, he did mention that he made some changes after receiving the information. He expressed interest in educational information about healthy cooking.  RD student reviewed low salt diet information, and heallhy cooking tips such as lower fat cooking methods and using herbs and spices to season foods. Patient provided the: "What does Healthy Eating man?" handout by the [employer] on Aging.  Teach-back method used.   Labs: TG 195, HDL 26, LDL 122  Meds reviewed.   Diet Order:  Diet Heart Room service appropriate?: Yes; Fluid consistency:: Thin Diet - low sodium heart healthy  Skin:  Reviewed, no issues  Last BM:  09/15/2015  Height:   Ht Readings from Last 1 Encounters:  09/15/15 5\' 11"  (1.803 m)    Weight:   Wt Readings from Last 1 Encounters:  09/15/15 203 lb (92.08 kg)    Ideal Body Weight:  78.2 kg  BMI:  Body mass index is 28.33 kg/(m^2).  Estimated Nutritional Needs:   Kcal:  1800 - 2000 kcal  Protein:  95-105 g  Fluid:  1.8 - 2.0 L/day  EDUCATION NEEDS:   Education needs addressed  Kayleen Memos, BA. BS Dietetic Intern 09/16/2015 11:55 AM

## 2015-09-16 NOTE — Evaluation (Signed)
Occupational Therapy Evaluation Patient Details Name: Matthew Williams MRN: [id number] DOB: 08-14-1946 Today's Date: 09/16/2015    History of Present Illness Pt is a 69 y.o. male At approximately 12:00 patient experienced left arm numbness and weakness from the shoulder to the hand associated with left-sided headache and left facial lip tingling. 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 Denies any lower extremity weakness. Patient also describes a feeling of being woozy. Denies any LOC, palpitations, chest pain, shortness of breath, nausea, vomiting, fevers, diaphoresis. Symptoms were constant and worsened until arriving in the ED when they began to resolve. Patient is currently asymptomatic.   Clinical Impression   Pt awake, alert, and eating breakfast this am. Pt reports symptoms have resolved since arrival. Pt demonstrates BUE range of motion WNL, strength 5/5. Pt is independent in ADL tasks. Pt reports he is concerned about his balance, as it seems "off" at times and he is supposed to go on a hunting trip to [place] next week. Pt is at baseline with ADL tasks, no further acute OT services required at this time.     Follow Up Recommendations  No OT follow up    Equipment Recommendations  None recommended by OT       Precautions / Restrictions Precautions Precautions: None Restrictions Weight Bearing Restrictions: No      Mobility Bed Mobility Overal bed mobility: Independent                Transfers Overall transfer level: Independent                         ADL Overall ADL's : Independent;At baseline Eating/Feeding: Independent                   Lower Body Dressing: Independent                       Vision Vision Assessment?: Yes Eye Alignment: Within Functional Limits Ocular Range of Motion: Within Functional Limits Alignment/Gaze Preference:  Within Defined Limits Tracking/Visual Pursuits: Able to track stimulus in all quads without difficulty Saccades: Within functional limits Convergence: Within functional limits Visual Fields: No apparent deficits          Pertinent Vitals/Pain Pain Assessment: No/denies pain     Hand Dominance Right   Extremity/Trunk Assessment Upper Extremity Assessment Upper Extremity Assessment: Overall WFL for tasks assessed   Lower Extremity Assessment Lower Extremity Assessment: Defer to PT evaluation       Communication Communication Communication: No difficulties   Cognition Arousal/Alertness: Awake/alert Behavior During Therapy: WFL for tasks assessed/performed Overall Cognitive Status: Within Functional Limits for tasks assessed                                Home Living Family/patient expects to be discharged to:: Private residence Living Arrangements: Alone   Type of Home: House             Bathroom Shower/Tub: Tub/shower unit;Walk-in shower   Bathroom Toilet: Standard     Home Equipment: None          Prior Functioning/Environment Level of Independence: Independent              End of Session    Activity Tolerance: Patient tolerated treatment well Patient left: in bed;with call  bell/phone within reach   Time: 0807-0830 OT Time Calculation (min): 23 min Charges:  OT General Charges $OT Visit: 1 Procedure OT Evaluation $Initial OT Evaluation Tier I: 1 Procedure G-Codes: OT G-codes **NOT FOR INPATIENT CLASS** Functional Assessment Tool Used: Clinical judgement Functional Limitation: Self care Self Care Current Status (A1655): At least 1 percent but less than 20 percent impaired, limited or restricted Self Care Goal Status (V7482): At least 1 percent but less than 20 percent impaired, limited or restricted Self Care Discharge Status [phone]): At least 1 percent but less than 20 percent impaired, limited or restricted    Guadelupe Sabin,  OTR/L  [phone]  09/16/2015, 9:13 AM

## 2015-09-16 NOTE — Progress Notes (Signed)
Pt given discharge instructions, prescriptions, and care notes. Pt verbalized understanding AEB no further questions or concerns at this time. IV was discontinued, no redness, pain, or swelling noted at this time. Telemetry discontinued and Centralized Telemetry was notified. Pt left the floor via ambulation with staff in stable condition. Precious Gilding, RN.

## 2015-09-16 NOTE — Care Management Note (Addendum)
Case Management Note  Patient Details  Name: Matthew Williams MRN: [id number] Date of Birth: 11-17-1946  Expected Discharge Date:   09/16/2015               Expected Discharge Plan:  Home/Self Care  In-House Referral:  NA  Discharge planning Services  CM Consult  Post Acute Care Choice:  NA Choice offered to:  NA  DME Arranged:    DME Agency:     HH Arranged:    [place] Agency:     Status of Service:  Completed, signed off  Medicare Important Message Given:    Date Medicare IM Given:    Medicare IM give by:    Date Additional Medicare IM Given:    Additional Medicare Important Message give by:     If discussed at [place] of Stay Meetings, dates discussed:    Additional Comments: Pt is from home, lives alone and is ind at baseline. Pt admitted obs for CVA/TIA r/o. Pt plans to return home with self care at DC. No CM needs noted.  Sherald Barge, RN 09/16/2015, 9:28 AM

## 2015-09-17 LAB — VITAMIN B12: VITAMIN B 12: 338 pg/mL (ref 180–914)

## 2015-09-17 LAB — RPR: RPR Ser Ql: NONREACTIVE

## 2015-09-17 LAB — HEMOGLOBIN A1C
HEMOGLOBIN A1C: 6 % — AB (ref 4.8–5.6)
MEAN PLASMA GLUCOSE: 126 mg/dL

## 2015-09-18 LAB — HOMOCYSTEINE: Homocysteine: 7.6 umol/L (ref 0.0–15.0)

## 2015-09-21 ENCOUNTER — Ambulatory Visit (HOSPITAL_COMMUNITY): Payer: Medicare Other

## 2015-09-23 ENCOUNTER — Ambulatory Visit (HOSPITAL_COMMUNITY): Payer: Medicare Other

## 2015-10-13 ENCOUNTER — Inpatient Hospital Stay (HOSPITAL_COMMUNITY): Admission: RE | Admit: 2015-10-13 | Payer: Medicare Other | Source: Ambulatory Visit

## 2015-11-14 ENCOUNTER — Telehealth: Payer: Self-pay | Admitting: *Deleted

## 2015-11-14 ENCOUNTER — Encounter (HOSPITAL_COMMUNITY): Payer: Self-pay | Admitting: Emergency Medicine

## 2015-11-14 ENCOUNTER — Emergency Department (HOSPITAL_COMMUNITY): Payer: Medicare Other

## 2015-11-14 ENCOUNTER — Emergency Department (HOSPITAL_COMMUNITY)
Admission: EM | Admit: 2015-11-14 | Discharge: 2015-11-14 | Disposition: A | Discharge status: Home / Self Care | Payer: Medicare Other | Attending: Emergency Medicine | Admitting: Emergency Medicine

## 2015-11-14 DIAGNOSIS — R200 Anesthesia of skin: Secondary | ICD-10-CM

## 2015-11-14 DIAGNOSIS — Z79899 Other long term (current) drug therapy: Secondary | ICD-10-CM | POA: Insufficient documentation

## 2015-11-14 DIAGNOSIS — I2510 Atherosclerotic heart disease of native coronary artery without angina pectoris: Secondary | ICD-10-CM | POA: Insufficient documentation

## 2015-11-14 DIAGNOSIS — Z8639 Personal history of other endocrine, nutritional and metabolic disease: Secondary | ICD-10-CM | POA: Diagnosis not present

## 2015-11-14 DIAGNOSIS — I10 Essential (primary) hypertension: Secondary | ICD-10-CM | POA: Diagnosis not present

## 2015-11-14 DIAGNOSIS — R51 Headache: Secondary | ICD-10-CM | POA: Insufficient documentation

## 2015-11-14 DIAGNOSIS — Z7902 Long term (current) use of antithrombotics/antiplatelets: Secondary | ICD-10-CM | POA: Diagnosis not present

## 2015-11-14 DIAGNOSIS — Z951 Presence of aortocoronary bypass graft: Secondary | ICD-10-CM | POA: Insufficient documentation

## 2015-11-14 DIAGNOSIS — Z7982 Long term (current) use of aspirin: Secondary | ICD-10-CM | POA: Diagnosis not present

## 2015-11-14 DIAGNOSIS — Z85820 Personal history of malignant melanoma of skin: Secondary | ICD-10-CM | POA: Diagnosis not present

## 2015-11-14 DIAGNOSIS — Z8673 Personal history of transient ischemic attack (TIA), and cerebral infarction without residual deficits: Secondary | ICD-10-CM | POA: Insufficient documentation

## 2015-11-14 DIAGNOSIS — E039 Hypothyroidism, unspecified: Secondary | ICD-10-CM | POA: Diagnosis not present

## 2015-11-14 LAB — BASIC METABOLIC PANEL
ANION GAP: 5 (ref 5–15)
BUN: 15 mg/dL (ref 6–20)
CALCIUM: 8.9 mg/dL (ref 8.9–10.3)
CO2: 33 mmol/L — ABNORMAL HIGH (ref 22–32)
Chloride: 102 mmol/L (ref 101–111)
Creatinine, Ser: 0.98 mg/dL (ref 0.61–1.24)
GLUCOSE: 105 mg/dL — AB (ref 65–99)
POTASSIUM: 3.4 mmol/L — AB (ref 3.5–5.1)
SODIUM: 140 mmol/L (ref 135–145)

## 2015-11-14 LAB — CBC WITH DIFFERENTIAL/PLATELET
BASOS ABS: 0.1 10*3/uL (ref 0.0–0.1)
BASOS PCT: 1 %
Eosinophils Absolute: 0.5 10*3/uL (ref 0.0–0.7)
Eosinophils Relative: 7 %
HEMATOCRIT: 44.2 % (ref 39.0–52.0)
HEMOGLOBIN: 15.1 g/dL (ref 13.0–17.0)
LYMPHS PCT: 36 %
Lymphs Abs: 2.3 10*3/uL (ref 0.7–4.0)
MCH: 30.2 pg (ref 26.0–34.0)
MCHC: 34.2 g/dL (ref 30.0–36.0)
MCV: 88.4 fL (ref 78.0–100.0)
Monocytes Absolute: 0.5 10*3/uL (ref 0.1–1.0)
Monocytes Relative: 8 %
NEUTROS ABS: 3.1 10*3/uL (ref 1.7–7.7)
NEUTROS PCT: 48 %
Platelets: 121 10*3/uL — ABNORMAL LOW (ref 150–400)
RBC: 5 MIL/uL (ref 4.22–5.81)
RDW: 13 % (ref 11.5–15.5)
WBC: 6.5 10*3/uL (ref 4.0–10.5)

## 2015-11-14 LAB — PROTIME-INR
INR: 1.03 (ref 0.00–1.49)
Prothrombin Time: 13.7 seconds (ref 11.6–15.2)

## 2015-11-14 NOTE — ED Notes (Signed)
Dr DeLo at bedside. 

## 2015-11-14 NOTE — ED Notes (Signed)
Patient states headache has resolved prior to discharge from ED.

## 2015-11-14 NOTE — ED Notes (Signed)
Patient states symptoms have resolved since arrival to ED.

## 2015-11-14 NOTE — ED Notes (Signed)
Patient complaining of left sided facial numbness and right arm numbness starting at 1430 today. No facial droop, no drifts noted to extremity at triage. Patient states he has had symptoms and been seen before for same.

## 2015-11-14 NOTE — ED Provider Notes (Signed)
CSN: [id number]     Arrival date & time 11/14/15  1818 History   First MD Initiated Contact with Patient 11/14/15 1822     Chief Complaint  Patient presents with  . Headache  . Numbness     (Consider location/radiation/quality/duration/timing/severity/associated sxs/prior Treatment) HPI Comments: Patient is a 69 year old male with history of coronary artery disease status post CABG approximately 20 years ago. He also has a history of hypertension, hypothyroidism, and suspected TIAs. He presents today with complaints of numbness in his right arm that started approximately 2:30 this afternoon. He also reports some numbness in the left side of his face but denies any numbness in the right leg or right side of face. He denies any weakness. He denies any visual complaints. He does report a mild headache.  He had an episode similar to this last month, and one the month before. Both times he was admitted and underwent extensive workup, however no evidence for a stroke was found. He was diagnosed with presumed TIA and treated with Plavix and aspirin. He reports he has been taking these medications. He reports that he has had approximately 8 of these episodes over the past year to year and a half. The other times he did not come to the hospital and they resolved on their own.  The history is provided by the patient.    Past Medical History  Diagnosis Date  . Syncope   . Coronary atherosclerosis of native coronary artery     Multivessel status post CABG  . Essential hypertension, benign   . Hyperlipidemia   . Hypothyroidism   . Cancer ([place])     melanoma on nose   Past Surgical History  Procedure Laterality Date  . Coronary artery bypass graft  1996  . Mitral valve repair    . Laparoscopic appendectomy    . Skin cancer excision    . Hernia repair     Family History  Problem Relation Age of Onset  . Heart attack Brother    Social History  Substance Use Topics  . Smoking status: Never  Smoker   . Smokeless tobacco: Never Used  . Alcohol Use: 0.0 oz/week    0 Standard drinks or equivalent per week     Comment: occ    Review of Systems  All other systems reviewed and are negative.     Allergies  Review of patient's allergies indicates no known allergies.  Home Medications   Prior to Admission medications   Medication Sig Start Date End Date Taking? Authorizing Provider  ALPRAZolam Duanne Moron) 0.5 MG tablet Take 0.5 mg by mouth at bedtime as needed for sleep.     Historical Provider, MD  aspirin 81 MG tablet Take 1 tablet (81 mg total) by mouth daily. 07/25/15   Samuella Cota, MD  clopidogrel (PLAVIX) 75 MG tablet Take 1 tablet (75 mg total) by mouth daily. 07/25/15   Samuella Cota, MD  hydrochlorothiazide (HYDRODIURIL) 25 MG tablet Take 25 mg by mouth daily.  08/10/15 08/09/16  Historical Provider, MD  levothyroxine (SYNTHROID, LEVOTHROID) 75 MCG tablet Take 75 mcg by mouth daily.    Historical Provider, MD  metoprolol succinate (TOPROL-XL) 50 MG 24 hr tablet Take 50 mg by mouth at bedtime. Take with or immediately following a meal.    Historical Provider, MD  nitroGLYCERIN (NITROSTAT) 0.4 MG SL tablet Place 1 tablet (0.4 mg total) under the tongue as directed. Patient taking differently: Place 0.4 mg under the tongue every 5 (  five) minutes as needed for chest pain.  08/05/12   Renella Cunas, MD  rosuvastatin (CRESTOR) 40 MG tablet Take 20 mg by mouth daily.     Historical Provider, MD   BP 147/79 mmHg  Pulse 65  Temp(Src) 97.5 F (36.4 C) (Oral)  Resp 17  SpO2 100% Physical Exam  Constitutional: He is oriented to person, place, and time. He appears well-developed and well-nourished. No distress.  HENT:  Head: Normocephalic and atraumatic.  Mouth/Throat: Oropharynx is clear and moist.  Eyes: EOM are normal. Pupils are equal, round, and reactive to light.  Neck: Normal range of motion. Neck supple.  Cardiovascular: Normal rate, regular rhythm and normal heart  sounds.   No murmur heard. Pulmonary/Chest: Effort normal and breath sounds normal. No respiratory distress. He has no wheezes.  Abdominal: Soft. Bowel sounds are normal. He exhibits no distension. There is no tenderness.  Musculoskeletal: Normal range of motion. He exhibits no edema.  Lymphadenopathy:    He has no cervical adenopathy.  Neurological: He is alert and oriented to person, place, and time. No cranial nerve deficit. He exhibits normal muscle tone. Coordination normal.  Skin: Skin is warm and dry. He is not diaphoretic.  Nursing note and vitals reviewed.   ED Course  Procedures (including critical care time) Labs Review Labs Reviewed  BASIC METABOLIC PANEL  CBC WITH DIFFERENTIAL/PLATELET  PROTIME-INR    Imaging Review No results found. I have personally reviewed and evaluated these images and lab results as part of my medical decision-making.   EKG Interpretation   Date/Time:  Monday November 14 2015 18:24:23 EST Ventricular Rate:  66 PR Interval:  146 QRS Duration: 143 QT Interval:  456 QTC Calculation: 478 R Axis:   95 Text Interpretation:  Sinus rhythm RBBB and LPFB Probable lateral infarct,  old Confirmed by Mercede Rollo  MD, Landrum Carbonell ([id number]) on 11/14/2015 6:36:15 PM      MDM   Final diagnoses:  None    Patient presents here with complaints of left facial numbness and right arm numbness that started approximately 2:30 this afternoon. He has had multiple episodes of this in the past and has been hospitalized twice in the past 2 months. He is undergone extensive workup for TIA, however this is been for the most part unremarkable. He is currently taking Plavix and a baby aspirin daily.  His his physical examination is unremarkable with no focal neurologic deficits.  Workup today reveals no evidence for acute stroke on his CAT scan and laboratory studies are unremarkable. Shortly after the patient arrived here his symptoms completely resolved and he now feels fine.  Upon reviewing his medical record, he has undergone MRI, echocardiogram, and studies of the circulation of his head and neck. He has small plaques, however no significant stenoses. His symptoms have completely resolved and he is neurologically intact. It appears as though his workup has been completed and I see no indication for admission at this time.  I have discussed these findings with the patient. He is also eager to go home. My instructions will be for him to follow-up with his primary Dr. to discuss the possibility of starting more aggressive anticoagulation.    Veryl Speak, MD 11/14/15 2022

## 2015-11-14 NOTE — Telephone Encounter (Signed)
Pt c/o face numbing/tingling, confusion, and weakness on both sides lasting for 5-7 minutes, denies any other symptoms. Thinks this is another TIA. Happened last night and today. Told pt to report to ED for further evaluation with symptoms and past TIA's. Pt agreeable to go, offered to call EMS or family member and pt declined and says he was not feeling bad now and would go to ED.

## 2015-11-14 NOTE — Discharge Instructions (Signed)
Follow-up with your primary Dr. this week to discuss these episodes you're having.  Return to the ER if symptoms significantly worsen or change.   Paresthesia Paresthesia is an abnormal burning or prickling sensation. This sensation is generally felt in the hands, arms, legs, or feet. However, it may occur in any part of the body. Usually, it is not painful. The feeling may be described as:  Tingling or numbness.  Pins and needles.  Skin crawling.  Buzzing.  Limbs falling asleep.  Itching. Most people experience temporary (transient) paresthesia at some time in their lives. Paresthesia may occur when you breathe too quickly (hyperventilation). It can also occur without any apparent cause. Commonly, paresthesia occurs when pressure is placed on a nerve. The sensation quickly goes away after the pressure is removed. For some people, however, paresthesia is a long-lasting (chronic) condition that is caused by an underlying disorder. If you continue to have paresthesia, you may need further medical evaluation. HOME CARE INSTRUCTIONS Watch your condition for any changes. Taking the following actions may help to lessen any discomfort that you are feeling:  Avoid drinking alcohol.  Try acupuncture or massage to help relieve your symptoms.  Keep all follow-up visits as directed by your health care provider. This is important. SEEK MEDICAL CARE IF:  You continue to have episodes of paresthesia.  Your burning or prickling feeling gets worse when you walk.  You have pain, cramps, or dizziness.  You develop a rash. SEEK IMMEDIATE MEDICAL CARE IF:  You feel weak.  You have trouble walking or moving.  You have problems with speech, understanding, or vision.  You feel confused.  You cannot control your bladder or bowel movements.  You have numbness after an injury.  You faint.   This information is not intended to replace advice given to you by your health care provider. Make  sure you discuss any questions you have with your health care provider.   Document Released: 11/23/2002 Document Revised: 04/19/2015 Document Reviewed: 11/29/2014 Elsevier Interactive Patient Education [employer].

## 2015-12-02 ENCOUNTER — Encounter: Payer: Self-pay | Admitting: Cardiology

## 2015-12-02 ENCOUNTER — Ambulatory Visit (INDEPENDENT_AMBULATORY_CARE_PROVIDER_SITE_OTHER): Payer: Medicare Other | Admitting: Cardiology

## 2015-12-02 VITALS — BP 110/68 | HR 62 | Ht 71.0 in | Wt 200.0 lb

## 2015-12-02 DIAGNOSIS — I10 Essential (primary) hypertension: Secondary | ICD-10-CM

## 2015-12-02 DIAGNOSIS — I2510 Atherosclerotic heart disease of native coronary artery without angina pectoris: Secondary | ICD-10-CM | POA: Diagnosis not present

## 2015-12-02 DIAGNOSIS — Z8673 Personal history of transient ischemic attack (TIA), and cerebral infarction without residual deficits: Secondary | ICD-10-CM

## 2015-12-02 NOTE — Patient Instructions (Addendum)
Your physician recommends that you continue on your current medications as directed. Please refer to the Current Medication list given to you today. Your physician has recommended that you wear an event monitor. Event monitors are medical devices that record the heart's electrical activity. Doctors most often [place] these monitors to diagnose arrhythmias. Arrhythmias are problems with the speed or rhythm of the heartbeat. The monitor is a small, portable device. You can wear one while you do your normal daily activities. This is usually used to diagnose what is causing palpitations/syncope (passing out). Your physician recommends that you schedule a follow-up appointment in: 1 month following start of wearing your event monitor.

## 2015-12-02 NOTE — Progress Notes (Signed)
Cardiology Office Note  Date: 12/02/2015   ID: Matthew Williams, DOB 05/25/1946, MRN [id number]  PCP: Curly Rim, MD  Primary Cardiologist: Rozann Lesches, MD   Chief Complaint  Patient presents with  . Coronary Artery Disease    History of Present Illness: Matthew Williams is a 69 y.o. male last seen in September. He presents for a follow-up visit. I reviewed his interval records. There has been been concern about recurring neurological symptoms manifested as arm and hand numbness on the right, possibility of TIA has been considered, although he has been on aspirin and Plavix with largely reassuring neuroimaging studies. He saw Dr. Neta Mends recently and it has been requested that a cardiac monitor be placed to exclude any obvious atrial arrhythmias. We discussed this today.  He states that he has had no recent symptoms of concern since his last ER encounter. He reports compliance with his medications.  We obtained a follow-up Cardiolite study in September for ischemic surveillance, results outlined below. He does not report any angina symptoms and has stable NYHA class II dyspnea.  He will be traveling out of town over the holidays, back on January 3 at which time he can proceed with cardiac monitoring.  Past Medical History  Diagnosis Date  . Syncope   . Coronary atherosclerosis of native coronary artery     Multivessel status post CABG  . Essential hypertension, benign   . Hyperlipidemia   . Hypothyroidism   . Melanoma in situ of nose [employer] [facility])     Current Outpatient Prescriptions  Medication Sig Dispense Refill  . ALPRAZolam (XANAX) 0.5 MG tablet Take 0.5 mg by mouth at bedtime as needed for sleep.     Marland Kitchen aspirin 81 MG tablet Take 1 tablet (81 mg total) by mouth daily.    . cetirizine (ZYRTEC) 10 MG tablet Take 10 mg by mouth at bedtime as needed for allergies.    Marland Kitchen clopidogrel (PLAVIX) 75 MG tablet Take 1 tablet (75 mg total) by mouth daily. 30 tablet 0  .  hydrochlorothiazide (HYDRODIURIL) 25 MG tablet Take 25 mg by mouth daily.     Marland Kitchen levothyroxine (SYNTHROID, LEVOTHROID) 75 MCG tablet Take 75 mcg by mouth daily.    . metoprolol succinate (TOPROL-XL) 50 MG 24 hr tablet Take 50 mg by mouth at bedtime. Take with or immediately following a meal.    . Multiple Vitamin (MULTIVITAMIN WITH MINERALS) TABS tablet Take 1 tablet by mouth daily.    . nitroGLYCERIN (NITROSTAT) 0.4 MG SL tablet Place 1 tablet (0.4 mg total) under the tongue as directed. (Patient taking differently: Place 0.4 mg under the tongue every 5 (five) minutes as needed for chest pain. ) 25 tablet 3  . rosuvastatin (CRESTOR) 40 MG tablet Take 20 mg by mouth at bedtime.      No current facility-administered medications for this visit.   Allergies:  Review of patient's allergies indicates no known allergies.   Social History: The patient  reports that he has never smoked. He has never used smokeless tobacco. He reports that he drinks alcohol. He reports that he does not use illicit drugs.   ROS:  Please see the history of present illness. Otherwise, complete review of systems is positive for none.  All other systems are reviewed and negative.   Physical Exam: VS:  BP 110/68 mmHg  Pulse 62  Ht 5\' 11"  (1.803 m)  Wt 200 lb (90.719 kg)  BMI 27.91 kg/m2  SpO2 98%, BMI Body mass  index is 27.91 kg/(m^2).  Wt Readings from Last 3 Encounters:  12/02/15 200 lb (90.719 kg)  09/15/15 203 lb (92.08 kg)  09/15/15 203 lb (92.08 kg)    Patient appears comfortable at rest. HEENT: Conjunctiva and lids normal, oropharynx clear. Neck: Supple, no elevated JVP or carotid bruits, no thyromegaly. Lungs: Clear to auscultation, nonlabored breathing at rest. Cardiac: Regular rate and rhythm, no S3 or significant systolic murmur, no pericardial rub. Abdomen: Soft, nontender, bowel sounds present, no guarding or rebound. Extremities: No pitting edema, distal pulses 2+.  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  Recent Labwork: 07/24/2015: ALT 27; AST 24 09/16/2015: TSH 0.966 11/14/2015: BUN 15; Creatinine, Ser 0.98; Hemoglobin 15.1; Platelets 121*; Potassium 3.4*; Sodium 140     Component Value Date/Time   CHOL 187 09/16/2015 0535   TRIG 195* 09/16/2015 0535   HDL 26* 09/16/2015 0535   CHOLHDL 7.2 09/16/2015 0535   VLDL 39 09/16/2015 0535   LDLCALC 122* 09/16/2015 0535   LDLDIRECT 91.3 04/10/2007 0839    Other Studies Reviewed Today:  Echocardiogram 07/25/2015: Study Conclusions  - Left ventricle: There was mild concentric hypertrophy. Systolic function was normal. The estimated ejection fraction was in the range of 60% to 65%. Wall motion was normal; there were no regional wall motion abnormalities. Features are consistent with a pseudonormal left ventricular filling pattern, with concomitant abnormal relaxation and increased filling pressure (grade 2 diastolic dysfunction). Doppler parameters are consistent with high ventricular filling pressure. - Aortic valve: Mildly calcified annulus. Mildly thickened, mildly calcified leaflets. There was moderate regurgitation. - Mitral valve: Possible prior mitral ring annuloplasty with moderate stenosis. Moderately calcified annulus. Mildly thickened leaflets . There was mild regurgitation. Mean gradient (D): 8 mm Hg. Valve area by pressure half-time: 1.38 cm^2. - Left atrium: The atrium was moderately dilated. Volume/bsa, S: 35 ml/m^2. - Pulmonary arteries: PA peak pressure: 33 mm Hg (S).  Carotid Dopplers 07/25/2015: IMPRESSION: Plaque at the level of both carotid bulbs and proximal internal carotid arteries, left greater than right. No significant stenosis is identified with estimated bilateral ICA stenoses of less than 50%.  Lexiscan Cardiolite 09/09/3015:  There was no ST segment deviation noted during stress.  Findings  consistent with prior inferior myocardial infarction with mild to moderate peri-infarct ischemia.  Nuclear stress EF: 59%.  Overall low to intermediate risk study  Assessment and Plan:  1. Symptomatically stable CAD status post CABG in 1996. Recent Cardiolite study showed inferior scar with mild to moderate peri-infarct ischemia and normal LVEF. In the absence of any progressing angina, we will continue medical therapy and observation. He most likely has developed graft disease over the years.  2. Recent concern about possible TIA symptoms as described above. We will plan on a 30 day event recorder to exclude any atrial arrhythmias that would necessitate a change to anticoagulant treatment from antiplatelet treatment.  3. Essential hypertension, blood pressure is normal today.  Current medicines were reviewed with the patient today.   Orders Placed This Encounter  Procedures  . Cardiac event monitor    Disposition: FU with me in 1 month.   Signed, Satira Sark, MD, [facility] 12/02/2015 1:12 PM    [facility] [address], [place], [place] [address] Phone: [phone]; Fax: [phone]

## 2015-12-16 ENCOUNTER — Ambulatory Visit: Payer: Medicare Other | Admitting: Cardiology

## 2015-12-22 ENCOUNTER — Ambulatory Visit (INDEPENDENT_AMBULATORY_CARE_PROVIDER_SITE_OTHER): Payer: Medicare Other

## 2015-12-22 DIAGNOSIS — Z8673 Personal history of transient ischemic attack (TIA), and cerebral infarction without residual deficits: Secondary | ICD-10-CM

## 2015-12-23 ENCOUNTER — Ambulatory Visit: Payer: Medicare Other | Admitting: Cardiology

## 2015-12-29 ENCOUNTER — Telehealth: Payer: Self-pay | Admitting: *Deleted

## 2015-12-29 NOTE — Telephone Encounter (Signed)
Returned your call.

## 2015-12-30 NOTE — Telephone Encounter (Signed)
Spoke with patient and he said that he has resolved the issue and the transmissions should be going through now. Patient said he did contact [employer].

## 2016-01-25 ENCOUNTER — Telehealth: Payer: Self-pay | Admitting: *Deleted

## 2016-01-25 NOTE — Telephone Encounter (Signed)
Patient informed and copy sent to PCP. 

## 2016-01-25 NOTE — Telephone Encounter (Signed)
-----   Message from Satira Sark, MD sent at 01/25/2016 10:24 AM EST ----- Reviewed. Please see report. Let him know that there was no evidence of atrial fibrillation or other arrhythmia to suggest a change in his current medical regimen. Forward copy to PCP.

## 2016-01-26 ENCOUNTER — Encounter: Payer: Self-pay | Admitting: Cardiology

## 2016-01-26 ENCOUNTER — Ambulatory Visit (INDEPENDENT_AMBULATORY_CARE_PROVIDER_SITE_OTHER): Payer: Medicare Other | Admitting: Cardiology

## 2016-01-26 VITALS — BP 118/71 | HR 71 | Ht 71.0 in | Wt 202.0 lb

## 2016-01-26 DIAGNOSIS — I2510 Atherosclerotic heart disease of native coronary artery without angina pectoris: Secondary | ICD-10-CM | POA: Diagnosis not present

## 2016-01-26 DIAGNOSIS — Z8673 Personal history of transient ischemic attack (TIA), and cerebral infarction without residual deficits: Secondary | ICD-10-CM

## 2016-01-26 NOTE — Patient Instructions (Signed)
Your physician recommends that you continue on your current medications as directed. Please refer to the Current Medication list given to you today. Your physician recommends that you schedule a follow-up appointment in: 6 months. You will receive a reminder letter in the mail in about 4 months reminding you to call and schedule your appointment. If you don't receive this letter, please contact our office. 

## 2016-01-26 NOTE — Progress Notes (Signed)
Cardiology Office Note  Date: 01/26/2016   ID: Matthew Williams 1946-07-12, MRN [id number]  PCP: Curly Rim, MD  Primary Cardiologist: Rozann Lesches, MD   Chief Complaint  Patient presents with  . Coronary Artery Disease  . Follow-up cardiac monitor    History of Present Illness: Matthew Williams is a 70 y.o. male last seen in his of her 2016. He presents for a follow-up visit. He states that he has felt well, no chest pain or palpitations, no focal neurological symptoms.  I reviewed his cardiac monitor which showed sinus rhythm and rare PVCs. There was no evidence of atrial fibrillation, and there were no pauses.  We discussed his medications which are outlined below. He continues on aspirin, Plavix, Toprol-XL, Crestor, and has as needed nitroglycerin. In the absence of any progressing symptoms, we will continue medical therapy.  Past Medical History  Diagnosis Date  . Syncope   . Coronary atherosclerosis of native coronary artery     Multivessel status post CABG  . Essential hypertension, benign   . Hyperlipidemia   . Hypothyroidism   . Melanoma in situ of nose [facility])     Current Outpatient Prescriptions  Medication Sig Dispense Refill  . ALPRAZolam (XANAX) 0.5 MG tablet Take 0.5 mg by mouth at bedtime as needed for sleep.     Marland Kitchen aspirin 81 MG tablet Take 1 tablet (81 mg total) by mouth daily.    . cetirizine (ZYRTEC) 10 MG tablet Take 10 mg by mouth at bedtime as needed for allergies.    Marland Kitchen clopidogrel (PLAVIX) 75 MG tablet Take 1 tablet (75 mg total) by mouth daily. 30 tablet 0  . hydrochlorothiazide (HYDRODIURIL) 25 MG tablet Take 25 mg by mouth daily.     Marland Kitchen levothyroxine (SYNTHROID, LEVOTHROID) 75 MCG tablet Take 75 mcg by mouth daily.    . metoprolol succinate (TOPROL-XL) 50 MG 24 hr tablet Take 50 mg by mouth at bedtime. Take with or immediately following a meal.    . Multiple Vitamin (MULTIVITAMIN WITH MINERALS) TABS tablet Take 1 tablet by mouth  daily.    . nitroGLYCERIN (NITROSTAT) 0.4 MG SL tablet Place 1 tablet (0.4 mg total) under the tongue as directed. (Patient taking differently: Place 0.4 mg under the tongue every 5 (five) minutes as needed for chest pain. ) 25 tablet 3  . rosuvastatin (CRESTOR) 40 MG tablet Take 20 mg by mouth at bedtime.      No current facility-administered medications for this visit.   Allergies:  Review of patient's allergies indicates no known allergies.   Social History: The patient  reports that he has never smoked. He has never used smokeless tobacco. He reports that he drinks alcohol. He reports that he does not use illicit drugs.   ROS:  Please see the history of present illness. Otherwise, complete review of systems is positive for none.  All other systems are reviewed and negative.   Physical Exam: VS:  BP 118/71 mmHg  Pulse 71  Ht 5\' 11"  (1.803 m)  Wt 202 lb (91.627 kg)  BMI 28.19 kg/m2  SpO2 98%, BMI Body mass index is 28.19 kg/(m^2).  Wt Readings from Last 3 Encounters:  01/26/16 202 lb (91.627 kg)  12/02/15 200 lb (90.719 kg)  09/15/15 203 lb (92.08 kg)    Patient appears comfortable at rest. HEENT: Conjunctiva and lids normal, oropharynx clear. Neck: Supple, no elevated JVP or carotid bruits, no thyromegaly. Lungs: Clear to auscultation, nonlabored breathing at rest.  Cardiac: Regular rate and rhythm, no S3 or significant systolic murmur, no pericardial rub. Abdomen: Soft, nontender, bowel sounds present, no guarding or rebound. Extremities: No pitting edema, distal pulses 2+.  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  Recent Labwork: 07/24/2015: ALT 27; AST 24 09/16/2015: TSH 0.966 11/14/2015: BUN 15; Creatinine, Ser 0.98; Hemoglobin 15.1; Platelets 121*; Potassium 3.4*; Sodium 140   Other Studies Reviewed Today:  Echocardiogram 07/25/2015: Study Conclusions  - Left  ventricle: There was mild concentric hypertrophy. Systolic function was normal. The estimated ejection fraction was in the range of 60% to 65%. Wall motion was normal; there were no regional wall motion abnormalities. Features are consistent with a pseudonormal left ventricular filling pattern, with concomitant abnormal relaxation and increased filling pressure (grade 2 diastolic dysfunction). Doppler parameters are consistent with high ventricular filling pressure. - Aortic valve: Mildly calcified annulus. Mildly thickened, mildly calcified leaflets. There was moderate regurgitation. - Mitral valve: Possible prior mitral ring annuloplasty with moderate stenosis. Moderately calcified annulus. Mildly thickened leaflets . There was mild regurgitation. Mean gradient (D): 8 mm Hg. Valve area by pressure half-time: 1.38 cm^2. - Left atrium: The atrium was moderately dilated. Volume/bsa, S: 35 ml/m^2. - Pulmonary arteries: PA peak pressure: 33 mm Hg (S).  Carotid Dopplers 07/25/2015: IMPRESSION: Plaque at the level of both carotid bulbs and proximal internal carotid arteries, left greater than right. No significant stenosis is identified with estimated bilateral ICA stenoses of less than 50%.  Lexiscan Cardiolite 09/09/3015:  There was no ST segment deviation noted during stress.  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  Nuclear stress EF: 59%.  Overall low to intermediate risk study  Assessment and Plan:  1. Symptomatically stable CAD status post CABG. LVEF is normal range and he had evidence of inferior scar with peri-infarct ischemia by evaluation last September. In the absence of progressing symptoms will continue medical therapy and observation.  2. Possible TIA symptoms, resolved. He continues on antiplatelet therapy. Recent cardiac monitor did not demonstrate any atrial  fibrillation.  Current medicines were reviewed with the patient today.  Disposition: FU with me in 6 months.   Signed, Satira Sark, MD, [facility] 01/26/2016 1:23 PM    [facility] [address], [place], [place] [address] Phone: [phone]; Fax: [phone]

## 2016-03-21 ENCOUNTER — Other Ambulatory Visit: Payer: Self-pay | Admitting: Dermatology

## 2016-03-21 DIAGNOSIS — C4491 Basal cell carcinoma of skin, unspecified: Secondary | ICD-10-CM

## 2016-03-21 HISTORY — DX: Basal cell carcinoma of skin, unspecified: C44.91

## 2016-05-13 ENCOUNTER — Emergency Department (HOSPITAL_COMMUNITY)
Admission: EM | Admit: 2016-05-13 | Discharge: 2016-05-13 | Disposition: A | Discharge status: Home / Self Care | Payer: No Typology Code available for payment source | Attending: Emergency Medicine | Admitting: Emergency Medicine

## 2016-05-13 ENCOUNTER — Encounter (HOSPITAL_COMMUNITY): Payer: Self-pay | Admitting: Emergency Medicine

## 2016-05-13 ENCOUNTER — Emergency Department (HOSPITAL_COMMUNITY): Payer: No Typology Code available for payment source

## 2016-05-13 DIAGNOSIS — Z79899 Other long term (current) drug therapy: Secondary | ICD-10-CM | POA: Insufficient documentation

## 2016-05-13 DIAGNOSIS — S60512A Abrasion of left hand, initial encounter: Secondary | ICD-10-CM | POA: Insufficient documentation

## 2016-05-13 DIAGNOSIS — Y929 Unspecified place or not applicable: Secondary | ICD-10-CM | POA: Insufficient documentation

## 2016-05-13 DIAGNOSIS — Z7982 Long term (current) use of aspirin: Secondary | ICD-10-CM | POA: Insufficient documentation

## 2016-05-13 DIAGNOSIS — R0602 Shortness of breath: Secondary | ICD-10-CM | POA: Diagnosis not present

## 2016-05-13 DIAGNOSIS — I2510 Atherosclerotic heart disease of native coronary artery without angina pectoris: Secondary | ICD-10-CM | POA: Diagnosis not present

## 2016-05-13 DIAGNOSIS — Y999 Unspecified external cause status: Secondary | ICD-10-CM | POA: Diagnosis not present

## 2016-05-13 DIAGNOSIS — E039 Hypothyroidism, unspecified: Secondary | ICD-10-CM | POA: Diagnosis not present

## 2016-05-13 DIAGNOSIS — S0990XA Unspecified injury of head, initial encounter: Secondary | ICD-10-CM | POA: Insufficient documentation

## 2016-05-13 DIAGNOSIS — Y939 Activity, unspecified: Secondary | ICD-10-CM | POA: Insufficient documentation

## 2016-05-13 DIAGNOSIS — I10 Essential (primary) hypertension: Secondary | ICD-10-CM | POA: Insufficient documentation

## 2016-05-13 DIAGNOSIS — E785 Hyperlipidemia, unspecified: Secondary | ICD-10-CM | POA: Diagnosis not present

## 2016-05-13 DIAGNOSIS — M79642 Pain in left hand: Secondary | ICD-10-CM | POA: Diagnosis present

## 2016-05-13 LAB — CBC WITH DIFFERENTIAL/PLATELET
BASOS ABS: 0.1 10*3/uL (ref 0.0–0.1)
BASOS PCT: 1 %
EOS ABS: 0.2 10*3/uL (ref 0.0–0.7)
Eosinophils Relative: 2 %
HCT: 44.8 % (ref 39.0–52.0)
Hemoglobin: 15.1 g/dL (ref 13.0–17.0)
Lymphocytes Relative: 30 %
Lymphs Abs: 2.5 10*3/uL (ref 0.7–4.0)
MCH: 29.2 pg (ref 26.0–34.0)
MCHC: 33.7 g/dL (ref 30.0–36.0)
MCV: 86.5 fL (ref 78.0–100.0)
Monocytes Absolute: 0.8 10*3/uL (ref 0.1–1.0)
Monocytes Relative: 9 %
Neutro Abs: 4.9 10*3/uL (ref 1.7–7.7)
Neutrophils Relative %: 59 %
PLATELETS: 92 10*3/uL — AB (ref 150–400)
RBC: 5.18 MIL/uL (ref 4.22–5.81)
RDW: 13 % (ref 11.5–15.5)
WBC: 8.3 10*3/uL (ref 4.0–10.5)

## 2016-05-13 LAB — BASIC METABOLIC PANEL
ANION GAP: 8 (ref 5–15)
BUN: 14 mg/dL (ref 6–20)
CALCIUM: 9.3 mg/dL (ref 8.9–10.3)
CO2: 28 mmol/L (ref 22–32)
Chloride: 100 mmol/L — ABNORMAL LOW (ref 101–111)
Creatinine, Ser: 1.02 mg/dL (ref 0.61–1.24)
GFR calc Af Amer: >60 mL/min (ref >60)
Glucose, Bld: 94 mg/dL (ref 65–99)
POTASSIUM: 3.5 mmol/L (ref 3.5–5.1)
SODIUM: 136 mmol/L (ref 135–145)

## 2016-05-13 MED ORDER — IOPAMIDOL (ISOVUE-300) INJECTION 61%
75.0000 mL | Freq: Once | INTRAVENOUS | Status: AC | PRN
  Administered 2016-05-13: 75 mL via INTRAVENOUS

## 2016-05-13 MED ORDER — HYDROCODONE-ACETAMINOPHEN 5-325 MG PO TABS: 1.0000 | ORAL_TABLET | Freq: Four times a day (QID) | ORAL | Status: DC | PRN
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
## 2016-05-13 NOTE — ED Notes (Signed)
C-collar placed on pt.

## 2016-05-13 NOTE — Discharge Instructions (Signed)
Follow-up with your family doctor this week to recheck your hand. Clean it twice a day with soap and water
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
## 2016-08-14 ENCOUNTER — Ambulatory Visit: Payer: Medicare Other | Admitting: Cardiology

## 2016-08-21 ENCOUNTER — Ambulatory Visit (INDEPENDENT_AMBULATORY_CARE_PROVIDER_SITE_OTHER): Payer: Medicare Other | Admitting: Cardiology

## 2016-08-21 ENCOUNTER — Encounter: Payer: Self-pay | Admitting: *Deleted

## 2016-08-21 VITALS — BP 133/78 | HR 68 | Ht 71.0 in | Wt 207.8 lb

## 2016-08-21 DIAGNOSIS — E782 Mixed hyperlipidemia: Secondary | ICD-10-CM

## 2016-08-21 DIAGNOSIS — I2510 Atherosclerotic heart disease of native coronary artery without angina pectoris: Secondary | ICD-10-CM | POA: Diagnosis not present

## 2016-08-21 DIAGNOSIS — Z9889 Other specified postprocedural states: Secondary | ICD-10-CM

## 2016-08-21 DIAGNOSIS — I10 Essential (primary) hypertension: Secondary | ICD-10-CM

## 2016-08-21 NOTE — Patient Instructions (Signed)
Your physician wants you to follow-up in: 6 months with DR. MCDOWELL You will receive a reminder letter in the mail two months in advance. If you don't receive a letter, please call our office to schedule the follow-up appointment.  Your physician recommends that you continue on your current medications as directed. Please refer to the Current Medication list given to you today.  Thank you for choosing [facility]!!
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
## 2017-02-25 ENCOUNTER — Encounter: Payer: Self-pay | Admitting: Cardiology

## 2017-02-25 ENCOUNTER — Ambulatory Visit (INDEPENDENT_AMBULATORY_CARE_PROVIDER_SITE_OTHER): Payer: Medicare Other | Admitting: Cardiology

## 2017-02-25 VITALS — BP 119/72 | HR 65 | Ht 71.0 in | Wt 209.0 lb

## 2017-02-25 DIAGNOSIS — Z9889 Other specified postprocedural states: Secondary | ICD-10-CM

## 2017-02-25 DIAGNOSIS — I2510 Atherosclerotic heart disease of native coronary artery without angina pectoris: Secondary | ICD-10-CM | POA: Diagnosis not present

## 2017-02-25 DIAGNOSIS — I10 Essential (primary) hypertension: Secondary | ICD-10-CM

## 2017-02-25 DIAGNOSIS — E782 Mixed hyperlipidemia: Secondary | ICD-10-CM

## 2017-02-25 MED ORDER — NITROGLYCERIN 0.4 MG SL SUBL: 0.4000 mg | SUBLINGUAL_TABLET | SUBLINGUAL | 3 refills | Status: DC

## 2017-02-25 NOTE — Addendum Note (Signed)
Addended by: Julian Hy T on: 02/25/2017 08:28 AM   Modules accepted: Orders

## 2017-02-25 NOTE — Progress Notes (Signed)
Cardiology Office Note  Date: 02/25/2017   ID: [place], DOB Feb 18, 1946, MRN [id number]  PCP: Curly Rim, MD  Primary Cardiologist: Rozann Lesches, MD   Chief Complaint  Patient presents with  . Coronary Artery Disease    History of Present Illness: [place] is a 71 y.o. male last seen in September 2017. He presents for a routine follow-up visit. Continues to do well without angina symptoms on medical therapy. He has not used nitroglycerin, has an outdated bottle which we will refill. He has not been as active over the winter after hunting season. Plans to travel with his son to [place] for a muscle car show this week.  We went over his medications. Current cardiac regimen includes aspirin, Plavix, HCTZ, Toprol-XL, Crestor, and as needed nitroglycerin. He continues to follow lipids with PCP.  I personally reviewed his ECG today which shows sinus rhythm with right bundle branch block.  Cardiac structural and ischemic testing from 2016 is outlined below.  Past Medical History:  Diagnosis Date  . Coronary atherosclerosis of native coronary artery    Multivessel status post CABG  . Essential hypertension, benign   . Hyperlipidemia   . Hypothyroidism   . 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)   . Syncope     Past Surgical History:  Procedure Laterality Date  . CORONARY ARTERY BYPASS GRAFT  1996  . HERNIA REPAIR    . LAPAROSCOPIC APPENDECTOMY    . MITRAL VALVE REPAIR    . SKIN CANCER EXCISION      Current Outpatient Prescriptions  Medication Sig Dispense Refill  . ALPRAZolam (XANAX) 0.5 MG tablet Take 0.5 mg by mouth at bedtime as needed for sleep.     Marland Kitchen aspirin 81 MG tablet Take 1 tablet (81 mg total) by mouth daily.    . clopidogrel (PLAVIX) 75 MG tablet Take 1 tablet (75 mg total) by mouth daily. 30 tablet 0  . hydrochlorothiazide (HYDRODIURIL) 25 MG tablet Take 1 tablet by mouth daily as needed.    Marland Kitchen levothyroxine (SYNTHROID, LEVOTHROID) 75 MCG  tablet Take 75 mcg by mouth daily.    . metoprolol succinate (TOPROL-XL) 50 MG 24 hr tablet Take 50 mg by mouth at bedtime. Take with or immediately following a meal.    . Multiple Vitamin (MULTIVITAMIN WITH MINERALS) TABS tablet Take 1 tablet by mouth daily.    . nitroGLYCERIN (NITROSTAT) 0.4 MG SL tablet Place 1 tablet (0.4 mg total) under the tongue as directed. 25 tablet 3  . rosuvastatin (CRESTOR) 40 MG tablet Take 20 mg by mouth at bedtime.      No current facility-administered medications for this visit.    Allergies:  Patient has no known allergies.   Social History: The patient  reports that he has never smoked. He has never used smokeless tobacco. He reports that he drinks alcohol. He reports that he does not use drugs.   ROS:  Please see the history of present illness. Otherwise, complete review of systems is positive for none.  All other systems are reviewed and negative.   Physical Exam: VS:  BP 119/72   Pulse 65   Ht 5\' 11"  (1.803 m)   Wt 209 lb (94.8 kg)   BMI 29.15 kg/m , BMI Body mass index is 29.15 kg/m.  Wt Readings from Last 3 Encounters:  02/25/17 209 lb (94.8 kg)  08/21/16 207 lb 12.8 oz (94.3 kg)  05/13/16 206 lb (93.4 kg)    Patient  appears comfortable at rest. HEENT: Conjunctiva and lids normal, oropharynx clear. Neck: Supple, no elevated JVP or carotid bruits, no thyromegaly. Lungs: Clear to auscultation, nonlabored breathing at rest. Cardiac: Regular rate and rhythm, no S3 or significant systolic murmur, no pericardial rub. Abdomen: Soft, nontender, bowel sounds present, no guarding or rebound. Extremities: No pitting edema, distal pulses 2+. Skin: Warm and dry. Musculoskeletal: No kyphosis. Neuropsychiatric: Alert and oriented 3, affect appropriate.  ECG: I personally reviewed the tracing from 05/13/2016 which showed sinus rhythm with left bundle branch block and left posterior fascicular block.  Recent Labwork: 05/13/2016: BUN 14; Creatinine, Ser  1.02; Hemoglobin 15.1; Platelets 92; Potassium 3.5; Sodium 136     Component Value Date/Time   CHOL 187 09/16/2015 0535   TRIG 195 (H) 09/16/2015 0535   HDL 26 (L) 09/16/2015 0535   CHOLHDL 7.2 09/16/2015 0535   VLDL 39 09/16/2015 0535   LDLCALC 122 (H) 09/16/2015 0535   LDLDIRECT 91.3 04/10/2007 0839    Other Studies Reviewed Today:  Echocardiogram 07/25/2015: Study Conclusions  - Left ventricle: There was mild concentric hypertrophy. Systolic function was normal. The estimated ejection fraction was in the range of 60% to 65%. Wall motion was normal; there were no regional wall motion abnormalities. Features are consistent with a pseudonormal left ventricular filling pattern, with concomitant abnormal relaxation and increased filling pressure (grade 2 diastolic dysfunction). Doppler parameters are consistent with high ventricular filling pressure. - Aortic valve: Mildly calcified annulus. Mildly thickened, mildly calcified leaflets. There was moderate regurgitation. - Mitral valve: Possible prior mitral ring annuloplasty with moderate stenosis. Moderately calcified annulus. Mildly thickened leaflets . There was mild regurgitation. Mean gradient (D): 8 mm Hg. Valve area by pressure half-time: 1.38 cm^2. - Left atrium: The atrium was moderately dilated. Volume/bsa, S: 35 ml/m^2. - Pulmonary arteries: PA peak pressure: 33 mm Hg (S).  Carotid Dopplers 07/25/2015: IMPRESSION: Plaque at the level of both carotid bulbs and proximal internal carotid arteries, left greater than right. No significant stenosis is identified with estimated bilateral ICA stenoses of less than 50%.  Lexiscan Cardiolite 09/09/3015:  There was no ST segment deviation noted during stress.  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  Nuclear stress EF: 59%.  Overall low to intermediate risk study  Assessment and  Plan:  1. Symptomatically stable CAD status post CABG. He continues on medical therapy, refill provided for updated nitroglycerin bottle. Cardiac structural and ischemic testing from within the last 2 years is outlined above. Continue observation.  2. Hyperlipidemia on Crestor. Continues to follow with PCP. Reports compliance and no intolerances.  3. Essential hypertension, blood pressure is well controlled today.  4. Mitral valve annuloplasty at the time of CABG. Echocardiogram from 2016 revealed mild mitral regurgitation.  Current medicines were reviewed with the patient today.   Orders Placed This Encounter  Procedures  . EKG 12-Lead    Disposition: Follow-up in 6 months.  Signed, Satira Sark, MD, [facility] 02/25/2017 8:17 AM    [facility] at [place], [place], [place] [address] Phone: [phone]; Fax: [phone]

## 2017-02-25 NOTE — Patient Instructions (Signed)

## 2017-08-25 NOTE — Progress Notes (Signed)
Cardiology Office Note  Date: 08/26/2017   ID: [place], DOB 1946/06/22, MRN [id number]  PCP: Matthew Rim, MD  Primary Cardiologist: Matthew Lesches, MD   Chief Complaint  Patient presents with  . Coronary Artery Disease    History of Present Illness: [place] is a 71 y.o. male last seen in March. He presents for a routine follow-up visit. Since last encounter he reports no angina symptoms or nitroglycerin use. He has gained some weight, not quite as active as usual. We discussed increasing his walking regimen.  I reviewed his medications. Current cardiac regimen includes aspirin, Plavix, HCTZ, Toprol-XL, Crestor, and nitroglycerin.  I reviewed his recent lab work from August per PCP. He states that he has not yet heard the results, we went over these today. Cholesterol is well controlled overall.  He underwent cardiac structural and ischemic testing within the last 2 years. We continue with observation.  Past Medical History:  Diagnosis Date  . Coronary atherosclerosis of native coronary artery    Multivessel status post CABG  . Essential hypertension, benign   . Hyperlipidemia   . Hypothyroidism   . Melanoma in situ of nose ([place])   . Syncope     Past Surgical History:  Procedure Laterality Date  . CORONARY ARTERY BYPASS GRAFT  1996  . HERNIA REPAIR    . LAPAROSCOPIC APPENDECTOMY    . MITRAL VALVE REPAIR    . SKIN CANCER EXCISION      Current Outpatient Prescriptions  Medication Sig Dispense Refill  . ALPRAZolam (XANAX) 0.5 MG tablet Take 0.5 mg by mouth at bedtime as needed for sleep.     Marland Kitchen aspirin 81 MG tablet Take 1 tablet (81 mg total) by mouth daily.    . clopidogrel (PLAVIX) 75 MG tablet Take 1 tablet (75 mg total) by mouth daily. 30 tablet 0  . hydrochlorothiazide (HYDRODIURIL) 25 MG tablet Take 1 tablet by mouth daily as needed.    Marland Kitchen levothyroxine (SYNTHROID, LEVOTHROID) 75 MCG tablet Take 75 mcg by mouth daily.    . metoprolol  succinate (TOPROL-XL) 50 MG 24 hr tablet Take 50 mg by mouth at bedtime. Take with or immediately following a meal.    . Multiple Vitamin (MULTIVITAMIN WITH MINERALS) TABS tablet Take 1 tablet by mouth daily.    . nitroGLYCERIN (NITROSTAT) 0.4 MG SL tablet Place 1 tablet (0.4 mg total) under the tongue as directed. 25 tablet 3  . rosuvastatin (CRESTOR) 40 MG tablet Take 20 mg by mouth at bedtime.      No current facility-administered medications for this visit.    Allergies:  Patient has no known allergies.   Social History: The patient  reports that he has never smoked. He has never used smokeless tobacco. He reports that he drinks alcohol. He reports that he does not use drugs.   ROS:  Please see the history of present illness. Otherwise, complete review of systems is positive for none.  All other systems are reviewed and negative.   Physical Exam: VS:  BP 128/72   Pulse 71   Ht 5\' 11"  (1.803 m)   Wt 216 lb (98 kg)   SpO2 97%   BMI 30.13 kg/m , BMI Body mass index is 30.13 kg/m.  Wt Readings from Last 3 Encounters:  08/26/17 216 lb (98 kg)  02/25/17 209 lb (94.8 kg)  08/21/16 207 lb 12.8 oz (94.3 kg)    Patient appears comfortable at rest. HEENT: Conjunctiva and lids  normal, oropharynx clear. Neck: Supple, no elevated JVP or carotid bruits, no thyromegaly. Lungs: Clear to auscultation, nonlabored breathing at rest. Cardiac: Regular rate and rhythm, no S3 or significant systolic murmur, no pericardial rub. Abdomen: Soft, nontender, bowel sounds present, no guarding or rebound. Extremities: No pitting edema, distal pulses 2+. Skin: Warm and dry. Musculoskeletal: No kyphosis. Neuropsychiatric: Alert and oriented 3, affect appropriate.  ECG: I personally reviewed the tracing from 02/25/2017 which showed sinus rhythm with right bundle branch block.  Recent Labwork:  August 2018: BUN 14, creatinine 1.03, potassium 4.6, AST 24. ALT 24, cholesterol 131, triglycerides 119, HDL 37,  LDL 70, TSH 2.5  Other Studies Reviewed Today:  Echocardiogram 07/25/2015: Study Conclusions  - Left ventricle: There was mild concentric hypertrophy. Systolic function was normal. The estimated ejection fraction was in the range of 60% to 65%. Wall motion was normal; there were no regional wall motion abnormalities. Features are consistent with a pseudonormal left ventricular filling pattern, with concomitant abnormal relaxation and increased filling pressure (grade 2 diastolic dysfunction). Doppler parameters are consistent with high ventricular filling pressure. - Aortic valve: Mildly calcified annulus. Mildly thickened, mildly calcified leaflets. There was moderate regurgitation. - Mitral valve: Possible prior mitral ring annuloplasty with moderate stenosis. Moderately calcified annulus. Mildly thickened leaflets . There was mild regurgitation. Mean gradient (D): 8 mm Hg. Valve area by pressure half-time: 1.38 cm^2. - Left atrium: The atrium was moderately dilated. Volume/bsa, S: 35 ml/m^2. - Pulmonary arteries: PA peak pressure: 33 mm Hg (S).  Carotid Dopplers 07/25/2015: IMPRESSION: Plaque at the level of both carotid bulbs and proximal internal carotid arteries, left greater than right. No significant stenosis is identified with estimated bilateral ICA stenoses of less than 50%.  Lexiscan Cardiolite 09/09/3015:  There was no ST segment deviation noted during stress.  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  Nuclear stress EF: 59%.  Overall low to intermediate risk study  Assessment and Plan:  1. Symptomatically stable CAD status post CABG in 1996. He reports no angina on medical therapy. Cardiolite from within the last 2 years showed a region of inferior scar with peri-infarct ischemia. In the absence of progressing angina, we will continue with observation for now.  2.  Essential hypertension, blood pressure is well controlled today. Continue HCTZ and Toprol-XL.  3. Mixed hyperlipidemia, remains on Crestor. Recent LDL 70.  4. 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 Echocardiogram from 2016 revealed mean mitral gradient of 8 mmHg and mild mitral regurgitation.  Current medicines were reviewed with the patient today.  Disposition: Follow-up in 6 months.  Signed, Satira Sark, MD, [facility] 08/26/2017 9:53 AM    [facility] at [place], [place], [place] [address] Phone: [phone]; Fax: [phone]

## 2017-08-26 ENCOUNTER — Ambulatory Visit (INDEPENDENT_AMBULATORY_CARE_PROVIDER_SITE_OTHER): Payer: Medicare Other | Admitting: Cardiology

## 2017-08-26 ENCOUNTER — Encounter: Payer: Self-pay | Admitting: Cardiology

## 2017-08-26 VITALS — BP 128/72 | HR 71 | Ht 71.0 in | Wt 216.0 lb

## 2017-08-26 DIAGNOSIS — E782 Mixed hyperlipidemia: Secondary | ICD-10-CM

## 2017-08-26 DIAGNOSIS — I2510 Atherosclerotic heart disease of native coronary artery without angina pectoris: Secondary | ICD-10-CM | POA: Diagnosis not present

## 2017-08-26 DIAGNOSIS — I10 Essential (primary) hypertension: Secondary | ICD-10-CM | POA: Diagnosis not present

## 2017-08-26 DIAGNOSIS — Z9889 Other specified postprocedural states: Secondary | ICD-10-CM

## 2017-08-26 NOTE — Patient Instructions (Signed)

## 2017-12-06 ENCOUNTER — Other Ambulatory Visit: Payer: Self-pay

## 2017-12-06 ENCOUNTER — Encounter (HOSPITAL_COMMUNITY): Payer: Self-pay | Admitting: Emergency Medicine

## 2017-12-06 ENCOUNTER — Emergency Department (HOSPITAL_COMMUNITY)
Admission: EM | Admit: 2017-12-06 | Discharge: 2017-12-06 | Disposition: A | Discharge status: Home / Self Care | Payer: Medicare Other | Attending: Emergency Medicine | Admitting: Emergency Medicine

## 2017-12-06 DIAGNOSIS — Z951 Presence of aortocoronary bypass graft: Secondary | ICD-10-CM | POA: Insufficient documentation

## 2017-12-06 DIAGNOSIS — R42 Dizziness and giddiness: Secondary | ICD-10-CM | POA: Insufficient documentation

## 2017-12-06 DIAGNOSIS — Z7982 Long term (current) use of aspirin: Secondary | ICD-10-CM | POA: Insufficient documentation

## 2017-12-06 DIAGNOSIS — F419 Anxiety disorder, unspecified: Secondary | ICD-10-CM | POA: Diagnosis not present

## 2017-12-06 DIAGNOSIS — I10 Essential (primary) hypertension: Secondary | ICD-10-CM | POA: Insufficient documentation

## 2017-12-06 DIAGNOSIS — Z79899 Other long term (current) drug therapy: Secondary | ICD-10-CM | POA: Insufficient documentation

## 2017-12-06 DIAGNOSIS — Z8673 Personal history of transient ischemic attack (TIA), and cerebral infarction without residual deficits: Secondary | ICD-10-CM | POA: Insufficient documentation

## 2017-12-06 DIAGNOSIS — R110 Nausea: Secondary | ICD-10-CM | POA: Diagnosis present

## 2017-12-06 DIAGNOSIS — Z7902 Long term (current) use of antithrombotics/antiplatelets: Secondary | ICD-10-CM | POA: Insufficient documentation

## 2017-12-06 DIAGNOSIS — I2510 Atherosclerotic heart disease of native coronary artery without angina pectoris: Secondary | ICD-10-CM | POA: Insufficient documentation

## 2017-12-06 DIAGNOSIS — E039 Hypothyroidism, unspecified: Secondary | ICD-10-CM | POA: Diagnosis not present

## 2017-12-06 LAB — CBC WITH DIFFERENTIAL/PLATELET
BASOS ABS: 0 10*3/uL (ref 0.0–0.1)
Basophils Relative: 0 %
Eosinophils Absolute: 0.1 10*3/uL (ref 0.0–0.7)
Eosinophils Relative: 1 %
HCT: 46.5 % (ref 39.0–52.0)
HEMOGLOBIN: 15.1 g/dL (ref 13.0–17.0)
LYMPHS ABS: 1.7 10*3/uL (ref 0.7–4.0)
LYMPHS PCT: 17 %
MCH: 29 pg (ref 26.0–34.0)
MCHC: 32.5 g/dL (ref 30.0–36.0)
MCV: 89.3 fL (ref 78.0–100.0)
Monocytes Absolute: 0.4 10*3/uL (ref 0.1–1.0)
Monocytes Relative: 4 %
NEUTROS ABS: 8 10*3/uL — AB (ref 1.7–7.7)
NEUTROS PCT: 78 %
Platelets: 175 10*3/uL (ref 150–400)
RBC: 5.21 MIL/uL (ref 4.22–5.81)
RDW: 13.2 % (ref 11.5–15.5)
WBC: 10.2 10*3/uL (ref 4.0–10.5)

## 2017-12-06 LAB — COMPREHENSIVE METABOLIC PANEL
ALK PHOS: 55 U/L (ref 38–126)
ALT: 26 U/L (ref 17–63)
AST: 28 U/L (ref 15–41)
Albumin: 3.8 g/dL (ref 3.5–5.0)
Anion gap: 12 (ref 5–15)
BUN: 14 mg/dL (ref 6–20)
CALCIUM: 9.2 mg/dL (ref 8.9–10.3)
CO2: 25 mmol/L (ref 22–32)
CREATININE: 0.97 mg/dL (ref 0.61–1.24)
Chloride: 101 mmol/L (ref 101–111)
Glucose, Bld: 134 mg/dL — ABNORMAL HIGH (ref 65–99)
Potassium: 3.2 mmol/L — ABNORMAL LOW (ref 3.5–5.1)
Sodium: 138 mmol/L (ref 135–145)
Total Bilirubin: 0.9 mg/dL (ref 0.3–1.2)
Total Protein: 6.9 g/dL (ref 6.5–8.1)

## 2017-12-06 MED ORDER — MECLIZINE HCL 12.5 MG PO TABS
25.0000 mg | ORAL_TABLET | Freq: Once | ORAL | Status: AC
  Administered 2017-12-06: 25 mg via ORAL
  Filled 2017-12-06: qty 2

## 2017-12-06 MED ORDER — LORAZEPAM 2 MG/ML IJ SOLN
0.5000 mg | Freq: Once | INTRAMUSCULAR | Status: AC
  Administered 2017-12-06: 0.5 mg via INTRAVENOUS
  Filled 2017-12-06: qty 1

## 2017-12-06 MED ORDER — LORAZEPAM 2 MG/ML IJ SOLN: 1.0000 mg | Freq: Once | INTRAMUSCULAR | Status: DC

## 2017-12-06 MED ORDER — POTASSIUM CHLORIDE CRYS ER 20 MEQ PO TBCR
40.0000 meq | EXTENDED_RELEASE_TABLET | Freq: Once | ORAL | Status: AC
  Administered 2017-12-06: 40 meq via ORAL
  Filled 2017-12-06: qty 2

## 2017-12-06 MED ORDER — MECLIZINE HCL 25 MG PO TABS: 25.0000 mg | ORAL_TABLET | Freq: Three times a day (TID) | ORAL | 0 refills | Status: AC | PRN

## 2017-12-06 MED ORDER — LORAZEPAM 2 MG/ML IJ SOLN
0.2500 mg | Freq: Once | INTRAMUSCULAR | Status: AC
  Administered 2017-12-06: 0.25 mg via INTRAVENOUS
  Filled 2017-12-06: qty 1

## 2017-12-06 NOTE — ED Notes (Signed)
Pt stated dizzy since middle of night only with movement. States feels room spins when he moves. Nad. Moving all ext. Denies weakness

## 2017-12-06 NOTE — Discharge Instructions (Signed)
Take the medication prescribed as needed for dizziness.  The medication may cause drowsiness.  No driving while feeling dizzy or while taking the medication prescribed.  Follow-up with your primary care physician if still feeling dizzy by next week.  Return if concern for any reason

## 2017-12-06 NOTE — ED Provider Notes (Signed)
[facility] EMERGENCY DEPARTMENT Provider Note   CSN: [id number] Arrival date & time: 12/06/17  8185     History   Chief Complaint Chief Complaint  Patient presents with  . Emesis    HPI [place] is a 71 y.o. male.  Patient awakened this morning with sensation of room spinning nausea and 2 episodes of dry heaving.  He was well before this morning.  Sensation of room spinning is worse with changing positions or moving his head improved with remaining still.  No visual changes no difficulty speaking no focal numbness or weakness.  No tenderness no change in hearing no other associated symptoms.  Treated by EMS with Zofran with some improvement of nausea however still complains of slight nausea  HPI  Past Medical History:  Diagnosis Date  . Coronary atherosclerosis of native coronary artery    Multivessel status post CABG  . Essential hypertension, benign   . Hyperlipidemia   . Hypothyroidism   . Melanoma in situ of nose ([place])   . Syncope     Patient Active Problem List   Diagnosis Date Noted  . Facial numbness 09/15/2015  . HLD (hyperlipidemia) 09/15/2015  . Anxiety state 09/15/2015  . Hypothyroidism 07/24/2015  . Left facial numbness 07/24/2015  . TIA (transient ischemic attack) 07/24/2015  . Mitral valve disease 08/19/2014  . Essential hypertension, benign 08/05/2012  . Mixed hyperlipidemia 08/06/2011  . Coronary atherosclerosis of native coronary artery 08/07/2010    Past Surgical History:  Procedure Laterality Date  . CORONARY ARTERY BYPASS GRAFT  1996  . HERNIA REPAIR    . LAPAROSCOPIC APPENDECTOMY    . MITRAL VALVE REPAIR    . SKIN CANCER EXCISION         Home Medications    Prior to Admission medications   Medication Sig Start Date End Date Taking? Authorizing Provider  ALPRAZolam Duanne Moron) 0.5 MG tablet Take 0.5 mg by mouth at bedtime as needed for sleep.     [provider]  aspirin 81 MG tablet Take 1 tablet (81 mg total) by  mouth daily. 07/25/15   Samuella Cota, MD  clopidogrel (PLAVIX) 75 MG tablet Take 1 tablet (75 mg total) by mouth daily. 07/25/15   Samuella Cota, MD  hydrochlorothiazide (HYDRODIURIL) 25 MG tablet Take 1 tablet by mouth daily as needed. 02/04/17   [provider]  levothyroxine (SYNTHROID, LEVOTHROID) 75 MCG tablet Take 75 mcg by mouth daily.    [provider]  metoprolol succinate (TOPROL-XL) 50 MG 24 hr tablet Take 50 mg by mouth at bedtime. Take with or immediately following a meal.    [provider]  Multiple Vitamin (MULTIVITAMIN WITH MINERALS) TABS tablet Take 1 tablet by mouth daily.    [provider]  nitroGLYCERIN (NITROSTAT) 0.4 MG SL tablet Place 1 tablet (0.4 mg total) under the tongue as directed. 02/25/17   Satira Sark, MD  rosuvastatin (CRESTOR) 40 MG tablet Take 20 mg by mouth at bedtime.     [provider]    Family History Family History  Problem Relation Age of Onset  . Heart attack Brother     Social History Social History   Tobacco Use  . Smoking status: Never Smoker  . Smokeless tobacco: Never Used  Substance Use Topics  . Alcohol use: Yes    Alcohol/week: 0.0 oz    Comment: occ  . Drug use: No     Allergies   Patient has no known allergies.  Review of Systems Review of Systems  Constitutional: Negative.   HENT: Negative.   Respiratory: Negative.   Cardiovascular: Negative.   Gastrointestinal: Positive for nausea.  Musculoskeletal: Negative.   Skin: Negative.   Neurological: Positive for dizziness.  Psychiatric/Behavioral: Negative.   All other systems reviewed and are negative.    Physical Exam Updated Vital Signs BP (!) 132/95 (BP Location: Right Arm)   Pulse 65   Temp 97.7 F (36.5 C) (Oral)   Resp 19   Ht 5\' 11"  (1.803 m)   Wt 95.3 kg (210 lb)   SpO2 100%   BMI 29.29 kg/m   Physical Exam  Constitutional: He is oriented to person, place, and time. He appears  well-developed and well-nourished.  HENT:  Head: Normocephalic and atraumatic.  No facial asymmetry  Eyes: Conjunctivae are normal. Pupils are equal, round, and reactive to light.  No nystagmus ears bilateral tympanic membranes normal  Neck: Neck supple. No tracheal deviation present. No thyromegaly present.  No bruit  Cardiovascular: Normal rate and regular rhythm.  No murmur heard. Pulmonary/Chest: Effort normal and breath sounds normal.  Abdominal: Soft. Bowel sounds are normal. He exhibits no distension. There is no tenderness.  Musculoskeletal: Normal range of motion. He exhibits no edema or tenderness.  Neurological: He is alert and oriented to person, place, and time. No cranial nerve deficit. Coordination normal.  Comes vertiginous upon rotating his head from side to side.  Motor strength 5/5 overall DTR symmetric bilaterally knee jerk ankle jerk and bice toes downgoing bilaterally  Skin: Skin is warm and dry. No rash noted.  Psychiatric: He has a normal mood and affect.  Nursing note and vitals reviewed.    ED Treatments / Results  Labs (all labs ordered are listed, but only abnormal results are displayed) Labs Reviewed  CBC WITH DIFFERENTIAL/PLATELET  COMPREHENSIVE METABOLIC PANEL   Results for orders placed or performed during the hospital encounter of 12/06/17  CBC with Differential  Result Value Ref Range   WBC 10.2 4.0 - 10.5 K/uL   RBC 5.21 4.22 - 5.81 MIL/uL   Hemoglobin 15.1 13.0 - 17.0 g/dL   HCT 46.5 39.0 - 52.0 %   MCV 89.3 78.0 - 100.0 fL   MCH 29.0 26.0 - 34.0 pg   MCHC 32.5 30.0 - 36.0 g/dL   RDW 13.2 11.5 - 15.5 %   Platelets 175 150 - 400 K/uL   Neutrophils Relative % 78 %   Neutro Abs 8.0 (H) 1.7 - 7.7 K/uL   Lymphocytes Relative 17 %   Lymphs Abs 1.7 0.7 - 4.0 K/uL   Monocytes Relative 4 %   Monocytes Absolute 0.4 0.1 - 1.0 K/uL   Eosinophils Relative 1 %   Eosinophils Absolute 0.1 0.0 - 0.7 K/uL   Basophils Relative 0 %   Basophils Absolute  0.0 0.0 - 0.1 K/uL  Comprehensive metabolic panel  Result Value Ref Range   Sodium 138 135 - 145 mmol/L   Potassium 3.2 (L) 3.5 - 5.1 mmol/L   Chloride 101 101 - 111 mmol/L   CO2 25 22 - 32 mmol/L   Glucose, Bld 134 (H) 65 - 99 mg/dL   BUN 14 6 - 20 mg/dL   Creatinine, Ser 0.97 0.61 - 1.24 mg/dL   Calcium 9.2 8.9 - 10.3 mg/dL   Total Protein 6.9 6.5 - 8.1 g/dL   Albumin 3.8 3.5 - 5.0 g/dL   AST 28 15 - 41 U/L   ALT 26 17 - 63  U/L   Alkaline Phosphatase 55 38 - 126 U/L   Total Bilirubin 0.9 0.3 - 1.2 mg/dL   GFR calc non Af Amer >60 >60 mL/min   GFR calc Af Amer >60 >60 mL/min   Anion gap 12 5 - 15   No results found. EKG  EKG Interpretation None     .  Radiology No results found.  Procedures Procedures (including critical care time)  Medications Ordered in ED Medications  LORazepam (ATIVAN) injection 0.5 mg (not administered)  meclizine (ANTIVERT) tablet 25 mg (not administered)   10:10 AM feels improved after treatment with intravenous Ativan and oral meclizine  Initial Impression / Assessment and Plan / ED Course  I have reviewed the triage vital signs and the nursing notes.  Pertinent labs & imaging results that were available during my care of the patient were reviewed by me and considered in my medical decision making (see chart for details).     11:45 AM feels much improved after treatment with oral meclizine and IV Ativan.  He is alert Glasgow Coma Score 15 no longer nauseated ambulates without difficulty.  He received oral potassium supplementation. Vertigo was felt to be peripheral in etiology.  Plan prescription meclizine.  Follow-up with PMD if symptoms continue by next week. Final Clinical Impressions(s) / ED Diagnoses   Dx #1 vertigo #2 hypokalemia Final diagnoses:  None    ED Discharge Orders    None       Orlie Dakin, MD 12/06/17 1153

## 2017-12-06 NOTE — ED Triage Notes (Signed)
Per EMS, pt has had n/v for last several days. Emesis with ems x1. 4mg  of zofran administered en route with minimal relief. cbg en route 109. nad noted.

## 2018-03-10 NOTE — Progress Notes (Signed)
Cardiology Office Note  Date: 03/11/2018   ID: Matthew Williams, DOB 04-11-1946, MRN [id number]  PCP: Curly Rim, MD  Primary Cardiologist: Rozann Lesches, MD   Chief Complaint  Patient presents with  . Coronary Artery Disease    History of Present Illness: Matthew Williams is a 72 y.o. male last seen in September 2018.  He presents today for a follow-up visit, overall doing well without angina symptoms or nitroglycerin requirement.  He has been bird hunting out [place] since I last saw him.  Reports no major change in stamina.  Occasionally has some mild ankle swelling on long trips, but otherwise no recurring issues.  No orthopnea or PND.  Echocardiogram and Myoview from 2016 are outlined below.   I reviewed his medications.  Current cardiac regimen includes aspirin, Plavix, HCTZ, Toprol-XL, Crestor, and as needed nitroglycerin.  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  Past Medical History:  Diagnosis Date  . Coronary atherosclerosis of native coronary artery    Multivessel status post CABG  . Essential hypertension, benign   . Hyperlipidemia   . Hypothyroidism   . 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 Syncope     Past Surgical History:  Procedure Laterality Date  . CORONARY ARTERY BYPASS GRAFT  1996  . HERNIA REPAIR    . LAPAROSCOPIC APPENDECTOMY    . MITRAL VALVE REPAIR    . SKIN CANCER EXCISION      Current Outpatient Medications  Medication Sig Dispense Refill  . ALPRAZolam (XANAX) 0.5 MG tablet Take 0.5 mg by mouth at bedtime as needed for sleep.     Marland Kitchen aspirin 81 MG tablet Take 1 tablet (81 mg total) by mouth daily.    . clopidogrel (PLAVIX) 75 MG tablet Take 1 tablet (75 mg total) by mouth daily. 30 tablet 0  . hydrochlorothiazide (HYDRODIURIL) 25 MG tablet Take 1 tablet by mouth daily as needed.    Marland Kitchen levothyroxine (SYNTHROID, LEVOTHROID) 75 MCG tablet Take 75  mcg by mouth daily.    . meclizine (ANTIVERT) 25 MG tablet Take 1 tablet (25 mg total) by mouth 3 (three) times daily as needed for dizziness. 15 tablet 0  . metoprolol succinate (TOPROL-XL) 50 MG 24 hr tablet Take 50 mg by mouth at bedtime. Take with or immediately following a meal.    . Multiple Vitamin (MULTIVITAMIN WITH MINERALS) TABS tablet Take 1 tablet by mouth daily.    . nitroGLYCERIN (NITROSTAT) 0.4 MG SL tablet Place 1 tablet (0.4 mg total) under the tongue as directed. 25 tablet 3  . rosuvastatin (CRESTOR) 40 MG tablet Take 20 mg by mouth at bedtime.      No current facility-administered medications for this visit.    Allergies:  Patient has no known allergies.   Social History: The patient  reports that he has never smoked. He has never used smokeless tobacco. He reports that he drinks alcohol. He reports that he does not use drugs.   ROS:  Please see the history of present illness. Otherwise, complete review of systems is positive for none.  All other systems are reviewed and negative.   Physical Exam: VS:  BP 122/64   Pulse 61   Ht 5\' 11"  (1.803 m)   Wt 208 lb (94.3 kg)   SpO2 97%   BMI 29.01 kg/m , BMI Body mass index is 29.01 kg/m.  Wt Readings  from Last 3 Encounters:  03/11/18 208 lb (94.3 kg)  12/06/17 210 lb (95.3 kg)  08/26/17 216 lb (98 kg)    General: Patient appears comfortable at rest. HEENT: Conjunctiva and lids normal, oropharynx clear. Neck: Supple, no elevated JVP or carotid bruits, no thyromegaly. Lungs: Clear to auscultation, nonlabored breathing at rest. Cardiac: Regular rate and rhythm, no S3 or significant systolic murmur, no pericardial rub. Abdomen: Soft, nontender, bowel sounds present. Extremities: No pitting edema, distal pulses 2+. Skin: Warm and dry. Musculoskeletal: No kyphosis. Neuropsychiatric: Alert and oriented x3, affect grossly appropriate.   ECG: I personally reviewed the tracing from 02/25/2017 which showed sinus rhythm with  right bundle branch block.  Recent Labwork: 12/06/2017: ALT 26; AST 28; BUN 14; Creatinine, Ser 0.97; Hemoglobin 15.1; Platelets 175; Potassium 3.2; Sodium 138  August 2018: BUN 14, creatinine 1.03, potassium 4.6, AST 24. ALT 24, cholesterol 131, triglycerides 119, HDL 37, LDL 70, TSH 2.5  Other Studies Reviewed Today:  Echocardiogram 07/25/2015: Study Conclusions  - Left ventricle: There was mild concentric hypertrophy. Systolic function was normal. The estimated ejection fraction was in the range of 60% to 65%. Wall motion was normal; there were no regional wall motion abnormalities. Features are consistent with a pseudonormal left ventricular filling pattern, with concomitant abnormal relaxation and increased filling pressure (grade 2 diastolic dysfunction). Doppler parameters are consistent with high ventricular filling pressure. - Aortic valve: Mildly calcified annulus. Mildly thickened, mildly calcified leaflets. There was moderate regurgitation. - Mitral valve: Possible prior mitral ring annuloplasty with moderate stenosis. Moderately calcified annulus. Mildly thickened leaflets . There was mild regurgitation. Mean gradient (D): 8 mm Hg. Valve area by pressure half-time: 1.38 cm^2. - Left atrium: The atrium was moderately dilated. Volume/bsa, S: 35 ml/m^2. - Pulmonary arteries: PA peak pressure: 33 mm Hg (S).  Lexiscan Cardiolite 09/09/3015:  There was no ST segment deviation noted during stress.  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  Nuclear stress EF: 59%.  Overall low to intermediate risk study  Assessment and Plan:  1.  CAD status post CABG in 1996.  Ischemic testing from 2016 showed evidence of inferior scar with mild to moderate peri-infarct ischemia.  We have been managing him medically and he reports no angina on current regimen.  Follow-up ECG reviewed today.  Continue  with observation.  2.  Essential hypertension, on Toprol-XL and HCTZ.  Blood pressure is adequately controlled today.  3.  Hyperlipidemia.  He continues on Crestor.  LDL 70.  4.  History of mitral valve annuloplasty with residual mild mitral regurgitation and mean gradient 8 mmHg by last echocardiogram.  No change on examination.  Current medicines were reviewed with the patient today.   Orders Placed This Encounter  Procedures  . EKG 12-Lead    Disposition: Follow-up in 6 months.  Signed, Satira Sark, MD, [facility] 03/11/2018 9:34 AM    [place] at [place], [place], [place] [address] Phone: [phone]; Fax: [phone]

## 2018-03-11 ENCOUNTER — Encounter: Payer: Self-pay | Admitting: Cardiology

## 2018-03-11 ENCOUNTER — Ambulatory Visit (INDEPENDENT_AMBULATORY_CARE_PROVIDER_SITE_OTHER): Payer: Medicare Other | Admitting: Cardiology

## 2018-03-11 VITALS — BP 122/64 | HR 61 | Ht 71.0 in | Wt 208.0 lb

## 2018-03-11 DIAGNOSIS — I25119 Atherosclerotic heart disease of native coronary artery with unspecified angina pectoris: Secondary | ICD-10-CM | POA: Diagnosis not present

## 2018-03-11 DIAGNOSIS — I10 Essential (primary) hypertension: Secondary | ICD-10-CM | POA: Diagnosis not present

## 2018-03-11 DIAGNOSIS — Z9889 Other specified postprocedural states: Secondary | ICD-10-CM

## 2018-03-11 DIAGNOSIS — E782 Mixed hyperlipidemia: Secondary | ICD-10-CM

## 2018-03-11 NOTE — Patient Instructions (Signed)

## 2018-08-26 NOTE — Progress Notes (Signed)
Cardiology Office Note  Date: 08/27/2018   ID: Matthew Williams, DOB 11/28/1946, MRN [id number]  PCP: Curly Rim, MD  Primary Cardiologist: Rozann Lesches, MD   Chief Complaint  Patient presents with  . Coronary Artery Disease    History of Present Illness: Matthew Williams is a 72 y.o. male last seen in March.  He is here for a routine follow-up visit.  He does not report any angina symptoms or nitroglycerin use and states that he has been compliant with his medications.  He once again plans to go out [place], hunts on property out in [place] with his bird dogs.  I reviewed his current medications which are outlined below.  He reports no intolerances.  No bleeding problems on dual antiplatelet therapy.  He continues to follow lipids with PCP.  Last cardiac structural and ischemic testing was in 2016 as outlined below.  Past Medical History:  Diagnosis Date  . Coronary atherosclerosis of native coronary artery    Multivessel status post CABG  . Essential hypertension, benign   . Hyperlipidemia   . Hypothyroidism   . 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 Syncope     Past Surgical History:  Procedure Laterality Date  . CORONARY ARTERY BYPASS GRAFT  1996  . HERNIA REPAIR    . LAPAROSCOPIC APPENDECTOMY    . MITRAL VALVE REPAIR    . SKIN CANCER EXCISION      Current Outpatient Medications  Medication Sig Dispense Refill  . ALPRAZolam (XANAX) 0.5 MG tablet Take 0.5 mg by mouth at bedtime as needed for sleep.     Marland Kitchen aspirin 81 MG tablet Take 1 tablet (81 mg total) by mouth daily.    . clopidogrel (PLAVIX) 75 MG tablet Take 1 tablet (75 mg total) by mouth daily. 30 tablet 0  . hydrochlorothiazide (HYDRODIURIL) 25 MG tablet Take 1 tablet by mouth daily as needed.    Marland Kitchen levothyroxine (SYNTHROID, LEVOTHROID) 75 MCG tablet Take 75 mcg by mouth daily.    . meclizine (ANTIVERT) 25 MG tablet Take 1 tablet (25 mg total) by mouth 3 (three) times daily as needed for dizziness. 15  tablet 0  . metoprolol succinate (TOPROL-XL) 50 MG 24 hr tablet Take 50 mg by mouth at bedtime. Take with or immediately following a meal.    . Multiple Vitamin (MULTIVITAMIN WITH MINERALS) TABS tablet Take 1 tablet by mouth daily.    . nitroGLYCERIN (NITROSTAT) 0.4 MG SL tablet Place 1 tablet (0.4 mg total) under the tongue as directed. 25 tablet 3  . rosuvastatin (CRESTOR) 40 MG tablet Take 20 mg by mouth at bedtime.      No current facility-administered medications for this visit.    Allergies:  Patient has no known allergies.   Social History: The patient  reports that he has never smoked. He has never used smokeless tobacco. He reports that he drinks alcohol. He reports that he does not use drugs.   ROS:  Please see the history of present illness. Otherwise, complete review of systems is positive for none.  All other systems are reviewed and negative.   Physical Exam: VS:  BP 126/72   Pulse 65   Ht 5\' 11"  (1.803 m)   Wt 212 lb (96.2 kg)   SpO2 95%   BMI 29.57 kg/m , BMI Body mass index is 29.57 kg/m.  Wt Readings from Last 3 Encounters:  08/27/18 212 lb (96.2 kg)  03/11/18 208 lb (94.3 kg)  12/06/17 210 lb (95.3 kg)    General: Patient appears comfortable at rest. HEENT: Conjunctiva and lids normal, oropharynx clear. Neck: Supple, no elevated JVP or carotid bruits, no thyromegaly. Lungs: Clear to auscultation, nonlabored breathing at rest. Cardiac: Regular rate and rhythm, no S3 or significant systolic murmur. Abdomen: Soft, nontender, bowel sounds present. Extremities: No pitting edema, distal pulses 2+. Skin: Warm and dry. Musculoskeletal: No kyphosis. Neuropsychiatric: Alert and oriented x3, affect grossly appropriate.  ECG: I personally reviewed the tracing from 03/11/2018 which showed sinus rhythm with right bundle branch block and old inferolateral infarct pattern.  Recent Labwork: 12/06/2017: ALT 26; AST 28; BUN 14; Creatinine, Ser 0.97; Hemoglobin 15.1;  Platelets 175; Potassium 3.2; Sodium 138  August 2018: BUN 14, creatinine 1.03, potassium 4.6, AST 24. ALT 24, cholesterol 131, triglycerides 119, HDL 37, LDL 70, TSH 2.5  Other Studies Reviewed Today:  Echocardiogram 07/25/2015: Study Conclusions  - Left ventricle: There was mild concentric hypertrophy. Systolic function was normal. The estimated ejection fraction was in the range of 60% to 65%. Wall motion was normal; there were no regional wall motion abnormalities. Features are consistent with a pseudonormal left ventricular filling pattern, with concomitant abnormal relaxation and increased filling pressure (grade 2 diastolic dysfunction). Doppler parameters are consistent with high ventricular filling pressure. - Aortic valve: Mildly calcified annulus. Mildly thickened, mildly calcified leaflets. There was moderate regurgitation. - Mitral valve: Possible prior mitral ring annuloplasty with moderate stenosis. Moderately calcified annulus. Mildly thickened leaflets . There was mild regurgitation. Mean gradient (D): 8 mm Hg. Valve area by pressure half-time: 1.38 cm^2. - Left atrium: The atrium was moderately dilated. Volume/bsa, S: 35 ml/m^2. - Pulmonary arteries: PA peak pressure: 33 mm Hg (S).  Lexiscan Cardiolite 09/09/3015:  There was no ST segment deviation noted during stress.  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  Nuclear stress EF: 59%.  Overall low to intermediate risk study  Assessment and Plan:  1.  CAD with history of CABG in 1996.  He is stable without angina symptoms on medical therapy and underwent ischemic testing in 2016.  He likely has graft disease based on those results, but in the absence of progressive symptoms we will continue with medical therapy and observation.  2.  Essential hypertension, blood pressure is well controlled today.  No changes were made  today.  Keep follow-up with PCP.  3.  Mitral valve disease status post annuloplasty with mild residual regurgitation by echocardiogram in 2016.  No change on examination.  We will likely follow-up with repeat echocardiogram around the time of his next visit.  4.  Mixed hyperlipidemia, contains on Crestor with follow-up per PCP.  Last LDL was 70.  Current medicines were reviewed with the patient today.  Disposition: Follow-up in 6 months.   Signed, Satira Sark, MD, [facility] 08/27/2018 11:54 AM    [place] at [place], [place],  [address] Phone: [phone]; Fax: [phone]

## 2018-08-27 ENCOUNTER — Ambulatory Visit (INDEPENDENT_AMBULATORY_CARE_PROVIDER_SITE_OTHER): Payer: Medicare Other | Admitting: Cardiology

## 2018-08-27 ENCOUNTER — Encounter: Payer: Self-pay | Admitting: Cardiology

## 2018-08-27 VITALS — BP 126/72 | HR 65 | Ht 71.0 in | Wt 212.0 lb

## 2018-08-27 DIAGNOSIS — I25119 Atherosclerotic heart disease of native coronary artery with unspecified angina pectoris: Secondary | ICD-10-CM

## 2018-08-27 DIAGNOSIS — I10 Essential (primary) hypertension: Secondary | ICD-10-CM | POA: Diagnosis not present

## 2018-08-27 DIAGNOSIS — E782 Mixed hyperlipidemia: Secondary | ICD-10-CM | POA: Diagnosis not present

## 2018-08-27 DIAGNOSIS — Z9889 Other specified postprocedural states: Secondary | ICD-10-CM | POA: Diagnosis not present

## 2018-08-27 NOTE — Patient Instructions (Addendum)

## 2018-09-11 ENCOUNTER — Ambulatory Visit: Payer: Medicare Other | Admitting: Cardiology

## 2019-02-20 NOTE — Progress Notes (Signed)
Cardiology Office Note  Date: 02/23/2019   ID: Matthew Williams, DOB 06-Feb-1946, MRN [id number]  PCP: Curly Rim, MD  Primary Cardiologist: Rozann Lesches, MD   Chief Complaint  Patient presents with  . Coronary Artery Disease    History of Present Illness: Matthew Williams is a 73 y.o. male last seen in September 2019.  He is here for a follow-up visit.  Continues to do very well, no angina symptoms or nitroglycerin use.  Stays active, enjoys hunting, has been splitting wood recently.  We discussed obtaining a follow-up echocardiogram.  Last study was in 2016.  He has a history of mitral valve repair with ring annuloplasty.  He had mild mitral regurgitation in 2016 but moderate aortic regurgitation.  I personally reviewed his ECG today which shows sinus rhythm with right bundle branch block and possible old inferolateral infarct pattern.  Reviewed his medications which are stable as outlined below.  Past Medical History:  Diagnosis Date  . Coronary atherosclerosis of native coronary artery    Multivessel status post CABG  . Essential hypertension, benign   . Hyperlipidemia   . Hypothyroidism   . 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 Syncope     Past Surgical History:  Procedure Laterality Date  . CORONARY ARTERY BYPASS GRAFT  1996  . HERNIA REPAIR    . LAPAROSCOPIC APPENDECTOMY    . MITRAL VALVE REPAIR    . SKIN CANCER EXCISION      Current Outpatient Medications  Medication Sig Dispense Refill  . ALPRAZolam (XANAX) 0.5 MG tablet Take 0.5 mg by mouth at bedtime as needed for sleep.     Marland Kitchen aspirin 81 MG tablet Take 1 tablet (81 mg total) by mouth daily.    . clopidogrel (PLAVIX) 75 MG tablet Take 1 tablet (75 mg total) by mouth daily. 30 tablet 0  . hydrochlorothiazide (HYDRODIURIL) 25 MG tablet Take 1 tablet by mouth daily as needed.    Marland Kitchen levothyroxine (SYNTHROID, LEVOTHROID) 75 MCG tablet Take 75 mcg by mouth daily.    . meclizine (ANTIVERT) 25 MG tablet  Take 1 tablet (25 mg total) by mouth 3 (three) times daily as needed for dizziness. 15 tablet 0  . metoprolol succinate (TOPROL-XL) 50 MG 24 hr tablet Take 50 mg by mouth at bedtime. Take with or immediately following a meal.    . Multiple Vitamin (MULTIVITAMIN WITH MINERALS) TABS tablet Take 1 tablet by mouth daily.    . nitroGLYCERIN (NITROSTAT) 0.4 MG SL tablet Place 1 tablet (0.4 mg total) under the tongue as directed. 25 tablet 3  . rosuvastatin (CRESTOR) 40 MG tablet Take 20 mg by mouth at bedtime.      No current facility-administered medications for this visit.    Allergies:  Patient has no known allergies.   Social History: The patient  reports that he has never smoked. He has never used smokeless tobacco. He reports current alcohol use. He reports that he does not use drugs.   ROS:  Please see the history of present illness. Otherwise, complete review of systems is positive for none.  All other systems are reviewed and negative.   Physical Exam: VS:  BP 112/68   Pulse (!) 58   Ht 5\' 11"  (1.803 m)   Wt 200 lb (90.7 kg)   SpO2 98%   BMI 27.89 kg/m , BMI Body mass index is 27.89 kg/m.  Wt Readings from Last 3 Encounters:  02/23/19 200 lb (90.7  kg)  08/27/18 212 lb (96.2 kg)  03/11/18 208 lb (94.3 kg)    General: Patient appears comfortable at rest. HEENT: Conjunctiva and lids normal, oropharynx clear. Neck: Supple, no elevated JVP or carotid bruits, no thyromegaly. Lungs: Clear to auscultation, nonlabored breathing at rest. Cardiac: Regular rate and rhythm, no S3 or significant systolic murmur, no pericardial rub. Abdomen: Soft, nontender, bowel sounds present. Extremities: No pitting edema, distal pulses 2+. Skin: Warm and dry. Musculoskeletal: No kyphosis. Neuropsychiatric: Alert and oriented x3, affect grossly appropriate.  ECG: I personally reviewed the tracing from 03/11/2018 which showed sinus rhythm with right bundle branch block and old inferolateral infarct  pattern.  Recent Labwork: No results found for requested labs within last 8760 hours.     Component Value Date/Time   CHOL 187 09/16/2015 0535   TRIG 195 (H) 09/16/2015 0535   HDL 26 (L) 09/16/2015 0535   CHOLHDL 7.2 09/16/2015 0535   VLDL 39 09/16/2015 0535   LDLCALC 122 (H) 09/16/2015 0535   LDLDIRECT 91.3 04/10/2007 0839    Other Studies Reviewed Today:  Echocardiogram 07/25/2015: Study Conclusions  - Left ventricle: There was mild concentric hypertrophy. Systolic function was normal. The estimated ejection fraction was in the range of 60% to 65%. Wall motion was normal; there were no regional wall motion abnormalities. Features are consistent with a pseudonormal left ventricular filling pattern, with concomitant abnormal relaxation and increased filling pressure (grade 2 diastolic dysfunction). Doppler parameters are consistent with high ventricular filling pressure. - Aortic valve: Mildly calcified annulus. Mildly thickened, mildly calcified leaflets. There was moderate regurgitation. - Mitral valve: Possible prior mitral ring annuloplasty with moderate stenosis. Moderately calcified annulus. Mildly thickened leaflets . There was mild regurgitation. Mean gradient (D): 8 mm Hg. Valve area by pressure half-time: 1.38 cm^2. - Left atrium: The atrium was moderately dilated. Volume/bsa, S: 35 ml/m^2. - Pulmonary arteries: PA peak pressure: 33 mm Hg (S).  Lexiscan Cardiolite 09/09/3015:  There was no ST segment deviation noted during stress.  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  Nuclear stress EF: 59%.  Overall low to intermediate risk study  Assessment and Plan:  1.  CAD status post CABG in 1996.  He is doing well without active angina symptoms on medical therapy.  Graft disease is suspected based on Cardiolite from 2016 demonstrating mild to moderate peri-infarct ischemia in  the inferior distribution.  He continues to do well however with good stamina and we will continue with current plan.  2.  History of mitral valve disease status post annuloplasty.  Follow-up echocardiogram will be obtained.  3.  Aortic regurgitation, moderate by last echocardiogram in 2016.  4.  Mixed hyperlipidemia, continues on Crestor.  Current medicines were reviewed with the patient today.   Orders Placed This Encounter  Procedures  . EKG 12-Lead  . ECHOCARDIOGRAM COMPLETE    Disposition: Follow-up in 6 months.  Signed, Satira Sark, MD, [facility] 02/23/2019 10:04 AM    [place] at [place], [place], [place] [address] Phone: [phone]; Fax: [phone]

## 2019-02-23 ENCOUNTER — Encounter: Payer: Self-pay | Admitting: Cardiology

## 2019-02-23 ENCOUNTER — Ambulatory Visit (INDEPENDENT_AMBULATORY_CARE_PROVIDER_SITE_OTHER): Payer: Medicare Other | Admitting: Cardiology

## 2019-02-23 VITALS — BP 112/68 | HR 58 | Ht 71.0 in | Wt 200.0 lb

## 2019-02-23 DIAGNOSIS — I25119 Atherosclerotic heart disease of native coronary artery with unspecified angina pectoris: Secondary | ICD-10-CM

## 2019-02-23 DIAGNOSIS — E782 Mixed hyperlipidemia: Secondary | ICD-10-CM

## 2019-02-23 DIAGNOSIS — I351 Nonrheumatic aortic (valve) insufficiency: Secondary | ICD-10-CM

## 2019-02-23 DIAGNOSIS — I059 Rheumatic mitral valve disease, unspecified: Secondary | ICD-10-CM | POA: Diagnosis not present

## 2019-02-23 NOTE — Patient Instructions (Signed)
Medication Instructions:  Continue all current medications.  Testing/Procedures:  Your physician has requested that you have an echocardiogram. Echocardiography is a painless test that uses sound waves to create images of your heart. It provides your doctor with information about the size and shape of your heart and how well your heart's chambers and valves are working. This procedure takes approximately one hour. There are no restrictions for this procedure.  Office will contact with results via phone or letter.    Follow-Up: Your physician wants you to follow up in: 6 months.  You will receive a reminder letter in the mail one-two months in advance.  If you don't receive a letter, please call our office to schedule the follow up appointment   Any Other Special Instructions Will Be Listed Below (If Applicable).  If you need a refill on your cardiac medications before your next appointment, please call your pharmacy.

## 2019-03-18 ENCOUNTER — Other Ambulatory Visit: Payer: Medicare Other

## 2019-05-19 ENCOUNTER — Telehealth: Payer: Self-pay | Admitting: Cardiology

## 2019-05-19 NOTE — Telephone Encounter (Signed)

## 2019-05-20 ENCOUNTER — Telehealth: Payer: Self-pay | Admitting: *Deleted

## 2019-05-20 ENCOUNTER — Ambulatory Visit (INDEPENDENT_AMBULATORY_CARE_PROVIDER_SITE_OTHER): Payer: Medicare Other

## 2019-05-20 DIAGNOSIS — I059 Rheumatic mitral valve disease, unspecified: Secondary | ICD-10-CM

## 2019-05-20 NOTE — Telephone Encounter (Signed)
Patient informed. Copy sent to PCP °

## 2019-05-20 NOTE — Telephone Encounter (Signed)
-----   Message from Satira Sark, MD sent at 05/20/2019  2:40 PM EDT ----- Results reviewed.  Stable mitral annuloplasty with trivial mitral regurgitation and mild stenosis.  Also mild aortic regurgitation.  Continue with same follow-up plan.

## 2019-09-01 ENCOUNTER — Telehealth: Payer: Self-pay | Admitting: Cardiology

## 2019-09-01 NOTE — Telephone Encounter (Signed)
Virtual Visit Pre-Appointment Phone Call  "(Name), I am calling you today to discuss your upcoming appointment. We are currently trying to limit exposure to the virus that causes COVID-19 by seeing patients at home rather than in the office."  1. "What is the BEST phone number to call the day of the visit?" - [phone] (1ST) OR [phone] (2ND)   2. Do you have or have access to (through a family member/friend) a smartphone with video capability that we can use for your visit?" a. If yes - list this number in appt notes as cell (if different from BEST phone #) and list the appointment type as a VIDEO visit in appointment notes b. If no - list the appointment type as a PHONE visit in appointment notes  3. Confirm consent - "In the setting of the current Covid19 crisis, you are scheduled for a (phone or video) visit with your provider on (date) at (time).  Just as we do with many in-office visits, in order for you to participate in this visit, we must obtain consent.  If you'd like, I can send this to your mychart (if signed up) or email for you to review.  Otherwise, I can obtain your verbal consent now.  All virtual visits are billed to your insurance company just like a normal visit would be.  By agreeing to a virtual visit, we'd like you to understand that the technology does not allow for your provider to perform an examination, and thus may limit your provider's ability to fully assess your condition. If your provider identifies any concerns that need to be evaluated in person, we will make arrangements to do so.  Finally, though the technology is pretty good, we cannot assure that it will always work on either your or our end, and in the setting of a video visit, we may have to convert it to a phone-only visit.  In either situation, we cannot ensure that we have a secure connection.  Are you willing to proceed?" STAFF: Did the patient verbally acknowledge consent to telehealth visit?  Document YES/NO here: YES 4.   5. Advise patient to be prepared - "Two hours prior to your appointment, go ahead and check your blood pressure, pulse, oxygen saturation, and your weight (if you have the equipment to check those) and write them all down. When your visit starts, your provider will ask you for this information. If you have an Apple Watch or Kardia device, please plan to have heart rate information ready on the day of your appointment. Please have a pen and paper handy nearby the day of the visit as well."  6. Give patient instructions for MyChart download to smartphone OR Doximity/Doxy.me as below if video visit (depending on what platform provider is using)  7. Inform patient they will receive a phone call 15 minutes prior to their appointment time (may be from unknown caller ID) so they should be prepared to answer    TELEPHONE CALL NOTE  YOHEI TYSDAL has been deemed a candidate for a follow-up tele-health visit to limit community exposure during the Covid-19 pandemic. I spoke with the patient via phone to ensure availability of phone/video source, confirm preferred email & phone number, and discuss instructions and expectations.  I reminded Matthew Williams to be prepared with any vital sign and/or heart rhythm information that could potentially be obtained via home monitoring, at the time of his visit. I reminded GAMAL ENDERBY to expect a phone  call prior to his visit.  Chanda Busing 09/01/2019 3:45 PM   INSTRUCTIONS FOR DOWNLOADING THE MYCHART APP TO SMARTPHONE  - The patient must first make sure to have activated MyChart and know their login information - If Apple, go to [employer] and type in MyChart in the search bar and download the app. If Android, ask patient to go to [employer] and type in [place] in the search bar and download the app. The app is free but as with any other app downloads, their phone may require them to verify saved payment  information or Apple/Android password.  - The patient will need to then log into the app with their MyChart username and password, and select [facility] as their healthcare provider to link the account. When it is time for your visit, go to the MyChart app, find appointments, and click Begin Video Visit. Be sure to Select Allow for your device to access the Microphone and Camera for your visit. You will then be connected, and your provider will be with you shortly.  **If they have any issues connecting, or need assistance please contact MyChart service desk [phone])**  **If using a computer, in order to ensure the best quality for their visit they will need to use either of the following Internet Browsers: [employer], or Google Chrome**  IF USING DOXIMITY or DOXY.ME - The patient will receive a link just prior to their visit by text.     FULL LENGTH CONSENT FOR TELE-HEALTH VISIT   I hereby voluntarily request, consent and authorize [facility] and its employed or contracted physicians, physician assistants, nurse practitioners or other licensed health care professionals (the Practitioner), to provide me with telemedicine health care services (the Services") as deemed necessary by the treating Practitioner. I acknowledge and consent to receive the Services by the Practitioner via telemedicine. I understand that the telemedicine visit will involve communicating with the Practitioner through live audiovisual communication technology and the disclosure of certain medical information by electronic transmission. I acknowledge that I have been given the opportunity to request an in-person assessment or other available alternative prior to the telemedicine visit and am voluntarily participating in the telemedicine visit.  I understand that I have the right to withhold or withdraw my consent to the use of telemedicine in the course of my care at any time, without affecting my right  to future care or treatment, and that the Practitioner or I may terminate the telemedicine visit at any time. I understand that I have the right to inspect all information obtained and/or recorded in the course of the telemedicine visit and may receive copies of available information for a reasonable fee.  I understand that some of the potential risks of receiving the Services via telemedicine include:   Delay or interruption in medical evaluation due to technological equipment failure or disruption;  Information transmitted may not be sufficient (e.g. poor resolution of images) to allow for appropriate medical decision making by the Practitioner; and/or   In rare instances, security protocols could fail, causing a breach of personal health information.  Furthermore, I acknowledge that it is my responsibility to provide information about my medical history, conditions and care that is complete and accurate to the best of my ability. I acknowledge that Practitioner's advice, recommendations, and/or decision may be based on factors not within their control, such as incomplete or inaccurate data provided by me or distortions of diagnostic images or specimens that may result from  electronic transmissions. I understand that the practice of medicine is not an exact science and that Practitioner makes no warranties or guarantees regarding treatment outcomes. I acknowledge that I will receive a copy of this consent concurrently upon execution via email to the email address I last provided but may also request a printed copy by calling the office of [facility].    I understand that my insurance will be billed for this visit.   I have read or had this consent read to me.  I understand the contents of this consent, which adequately explains the benefits and risks of the Services being provided via telemedicine.   I have been provided ample opportunity to ask questions regarding this consent and the Services  and have had my questions answered to my satisfaction.  I give my informed consent for the services to be provided through the use of telemedicine in my medical care  By participating in this telemedicine visit I agree to the above.

## 2019-09-02 ENCOUNTER — Encounter: Payer: Self-pay | Admitting: Cardiology

## 2019-09-02 ENCOUNTER — Telehealth (INDEPENDENT_AMBULATORY_CARE_PROVIDER_SITE_OTHER): Payer: Medicare Other | Admitting: Cardiology

## 2019-09-02 VITALS — Ht 71.0 in | Wt 215.0 lb

## 2019-09-02 DIAGNOSIS — Z9889 Other specified postprocedural states: Secondary | ICD-10-CM | POA: Diagnosis not present

## 2019-09-02 DIAGNOSIS — E782 Mixed hyperlipidemia: Secondary | ICD-10-CM | POA: Diagnosis not present

## 2019-09-02 DIAGNOSIS — I25119 Atherosclerotic heart disease of native coronary artery with unspecified angina pectoris: Secondary | ICD-10-CM | POA: Diagnosis not present

## 2019-09-02 DIAGNOSIS — I10 Essential (primary) hypertension: Secondary | ICD-10-CM | POA: Diagnosis not present

## 2019-09-02 NOTE — Progress Notes (Signed)
Virtual Visit via Telephone Note   This visit type was conducted due to national recommendations for restrictions regarding the COVID-19 Pandemic (e.g. social distancing) in an effort to limit this patient's exposure and mitigate transmission in our community.  Due to his co-morbid illnesses, this patient is at least at moderate risk for complications without adequate follow up.  This format is felt to be most appropriate for this patient at this time.  The patient did not have access to video technology/had technical difficulties with video requiring transitioning to audio format only (telephone).  All issues noted in this document were discussed and addressed.  No physical exam could be performed with this format.  Please refer to the patient's chart for his  consent to telehealth for [facility]   Date:  09/02/2019   ID:  Matthew Williams, DOB 02-26-1946, MRN [id number]  Patient Location: Home Provider Location: Office  PCP:  Curly Rim, MD  Cardiologist:  Rozann Lesches, MD Electrophysiologist:  None   Evaluation Performed:  Follow-Up Visit  Chief Complaint:   Cardiac follow-up  History of Present Illness:    Matthew Williams is a 73 y.o. male last seen in March.  We spoke by phone today.  He had been out having breakfast with some friends.  Overall, he states that he has been doing reasonably well, does not have the stamina he had last year, but thinks it may be related to the hot and humid weather.  He remains active, walks regularly with his dogs and on his property.  He plans to travel out [place] this winter to go hunting again.  He has not used any nitroglycerin.  Follow-up echocardiogram from June revealed LVEF 55 to 60%, mitral ring annuloplasty with trivial mitral regurgitation and mild stenosis with mean gradient 6 mmHg, mild aortic regurgitation.  I discussed these results with him today.  We went over his medications which are listed below.  Cardiac regimen is  stable.  The patient does not have symptoms concerning for COVID-19 infection (fever, chills, cough, or new shortness of breath).    Past Medical History:  Diagnosis Date  . BCC (basal cell carcinoma of skin) 04/04/2009   Left Nostril (MOH's)  . BCC (basal cell carcinoma of skin) 04/23/2011   Left Preauricular (Cx3,5FU)  . Coronary atherosclerosis of native coronary artery    Multivessel status post CABG  . Essential hypertension   . Hyperlipidemia   . Hypothyroidism   . 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 SCC (squamous cell carcinoma) 02/03/2013   Right Neck-Well Diff (Cx3,5FU)  . SCC (squamous cell carcinoma) 04/21/2014   Left Forearm (tx p bx)  . Superficial basal cell carcinoma (BCC) 02/03/2013   Left Preauricular (Cx3,5FU)  . Superficial nodular basal cell carcinoma (BCC) 03/21/2016   Left Upperarm (Cx3,5FU)  . Syncope    Past Surgical History:  Procedure Laterality Date  . CORONARY ARTERY BYPASS GRAFT  1996  . HERNIA REPAIR    . LAPAROSCOPIC APPENDECTOMY    . MITRAL VALVE REPAIR    . SKIN CANCER EXCISION       Current Meds  Medication Sig  . ALPRAZolam (XANAX) 0.5 MG tablet Take 0.5 mg by mouth at bedtime as needed for sleep.   Marland Kitchen aspirin 81 MG tablet Take 1 tablet (81 mg total) by mouth daily.  . clopidogrel (PLAVIX) 75 MG tablet Take 1 tablet (  75 mg total) by mouth daily.  . hydrochlorothiazide (HYDRODIURIL) 25 MG tablet Take 1 tablet by mouth daily.   Marland Kitchen levothyroxine (SYNTHROID, LEVOTHROID) 75 MCG tablet Take 75 mcg by mouth daily.  . meclizine (ANTIVERT) 25 MG tablet Take 1 tablet (25 mg total) by mouth 3 (three) times daily as needed for dizziness.  . metoprolol succinate (TOPROL-XL) 50 MG 24 hr tablet Take 50 mg by mouth at bedtime. Take with or immediately following a meal.  . nitroGLYCERIN (NITROSTAT) 0.4 MG SL tablet Place 1 tablet (0.4 mg total) under the tongue  as directed.  . rosuvastatin (CRESTOR) 40 MG tablet Take 20 mg by mouth at bedtime.   . tamsulosin (FLOMAX) 0.4 MG CAPS capsule Take 1 capsule by mouth daily as needed.     Allergies:   Patient has no known allergies.   Social History   Tobacco Use  . Smoking status: Never Smoker  . Smokeless tobacco: Never Used  Substance Use Topics  . Alcohol use: Yes    Alcohol/week: 0.0 standard drinks    Comment: occ  . Drug use: No     Family Hx: The patient's family history includes Heart attack in his brother.  ROS:   Please see the history of present illness. All other systems reviewed and are negative.  Prior CV studies:   The following studies were reviewed today:  Echocardiogram 05/20/2019:  1. The left ventricle has normal systolic function, with an ejection fraction of 55-60%. The cavity size was normal. There is mildly increased left ventricular wall thickness. Left ventricular diastolic Doppler parameters are consistent with impaired  relaxation. No evidence of left ventricular regional wall motion abnormalities.  2. The right ventricle has normal systolic function. The cavity was normal. There is no increase in right ventricular wall thickness. Right ventricular systolic pressure normal with an estimated pressure of 12.1 mmHg.  3. A ring annuloplasty valve is present in the mitral position. Mean gradient 6 mmHg and calculated valve area consistent with mild mitral stenosis. There is trivial mitral regurgitation.  4. The aortic valve is tricuspid. Mild thickening of the aortic valve. Mild calcification of the aortic valve. Aortic valve regurgitation is mild by color flow Doppler. Mild aortic annular calcification noted.  5. The tricuspid valve is grossly normal.  6. The aortic root is normal in size and structure.  Lexiscan Cardiolite 09/09/3015:  There was no ST segment deviation noted during stress.  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  Nuclear stress EF: 59%.  Overall low to intermediate risk study  Labs/Other Tests and Data Reviewed:    EKG:  An ECG dated 02/23/2019 was personally reviewed today and demonstrated:  Sinus rhythm with right bundle branch block and possible old inferolateral infarct pattern.  Recent Labs:  No interval lab work for review.  Wt Readings from Last 3 Encounters:  09/02/19 215 lb (97.5 kg)  02/23/19 200 lb (90.7 kg)  08/27/18 212 lb (96.2 kg)     Objective:    Vital Signs:  Ht 5\' 11"  (1.803 m)   Wt 215 lb (97.5 kg)   BMI 29.99 kg/m    He did not have a way to check vital signs today. Patient spoke in full sentences, not short of breath. No audible wheezing or coughing. Speech pattern normal.  ASSESSMENT & PLAN:    1.  CAD status post CABG in 1996 with suspected graft disease based on myocardial perfusion imaging from 2016.  He does not report frank angina at this time and describes NYHA class II dyspnea with most activities, stamina is not what it was last year however.  Current medical regimen has been well-tolerated, we will continue observation for now and reassess in 6 months.  2.  Mitral valve disease status post annuloplasty.  Follow-up echocardiogram in June showed only trivial mitral regurgitation and a mean gradient of 6 mmHg.  Unlikely to be contributing to any symptoms at this time.  3.  Mild aortic regurgitation by follow-up echocardiogram in June.  Asymptomatic.  4.  Mixed hyperlipidemia, he continues on Crestor with follow-up by PCP.  COVID-19 Education: The signs and symptoms of COVID-19 were discussed with the patient and how to seek care for testing (follow up with PCP or arrange E-visit).  The importance of social distancing was discussed today.  Time:   Today, I have spent 5 minutes with the patient with telehealth technology discussing the above problems.     Medication Adjustments/Labs and Tests Ordered: Current medicines  are reviewed at length with the patient today.  Concerns regarding medicines are outlined above.   Tests Ordered: No orders of the defined types were placed in this encounter.   Medication Changes: No orders of the defined types were placed in this encounter.   Follow Up:  In Person 6 months.  Signed, Rozann Lesches, MD  09/02/2019 8:31 AM    [facility]

## 2019-09-02 NOTE — Patient Instructions (Addendum)

## 2019-10-23 ENCOUNTER — Other Ambulatory Visit: Payer: Self-pay | Admitting: *Deleted

## 2019-10-23 DIAGNOSIS — Z20822 Contact with and (suspected) exposure to covid-19: Secondary | ICD-10-CM

## 2019-10-25 LAB — NOVEL CORONAVIRUS, NAA: SARS-CoV-2, NAA: DETECTED — AB

## 2019-10-26 ENCOUNTER — Telehealth: Payer: Self-pay | Admitting: *Deleted

## 2019-10-26 NOTE — Telephone Encounter (Signed)
Reviewed instructions for self isolation with pt he verbalized understanding: Non-Test Criteria for Ending Self-Isolation All persons with fever and respiratory symptoms should isolate themselves until ALL conditions listed below are met: - at least 10 days since symptoms onset - AND 3 consecutive days fever free without antipyretics (acetaminophen [Tylenol] or ibuprofen [Advil]) - AND improvement in respiratory symptoms

## 2020-03-17 ENCOUNTER — Encounter: Payer: Self-pay | Admitting: Cardiology

## 2020-03-17 ENCOUNTER — Other Ambulatory Visit: Payer: Self-pay

## 2020-03-17 ENCOUNTER — Ambulatory Visit (INDEPENDENT_AMBULATORY_CARE_PROVIDER_SITE_OTHER): Payer: Medicare Other | Admitting: Cardiology

## 2020-03-17 VITALS — BP 136/74 | HR 78 | Temp 97.2°F | Ht 71.0 in | Wt 215.0 lb

## 2020-03-17 DIAGNOSIS — I25119 Atherosclerotic heart disease of native coronary artery with unspecified angina pectoris: Secondary | ICD-10-CM

## 2020-03-17 DIAGNOSIS — Z9889 Other specified postprocedural states: Secondary | ICD-10-CM | POA: Diagnosis not present

## 2020-03-17 DIAGNOSIS — E782 Mixed hyperlipidemia: Secondary | ICD-10-CM

## 2020-03-17 MED ORDER — NITROGLYCERIN 0.4 MG SL SUBL: 0.4000 mg | SUBLINGUAL_TABLET | SUBLINGUAL | 3 refills | Status: DC

## 2020-03-17 NOTE — Progress Notes (Signed)
Cardiology Office Note  Date: 03/17/2020   ID: Matthew Williams, DOB 01/19/1946, MRN [id number]  PCP:  Curly Rim, MD  Cardiologist:  Rozann Lesches, MD Electrophysiologist:  None   Chief Complaint  Patient presents with  . Cardiac follow-up    History of Present Illness: Matthew Williams is a 74 y.o. male last assessed via telehealth encounter in September 2020.  He presents for a routine visit.  Since last assessment he does not report any progressive fatigue or shortness of breath, no angina symptoms or nitroglycerin use.  He did travel out [place] to hunt back in November.  I reviewed his medications which are stable from a cardiac perspective and outlined below.  He remains on high-dose Crestor.  Interval follow-up lab work showed LDL up to 83 from 69.  He admits that his diet has not been as strict over the last year, more sweets and fats.  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  Past Medical History:  Diagnosis Date  . BCC (basal cell carcinoma of skin) 04/04/2009   Left Nostril (MOH's)  . BCC (basal cell carcinoma of skin) 04/23/2011   Left Preauricular (Cx3,5FU)  . Coronary atherosclerosis of native coronary artery    Multivessel status post CABG  . Essential hypertension   . Hyperlipidemia   . Hypothyroidism   . 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 SCC (squamous cell carcinoma) 02/03/2013   Right Neck-Well Diff (Cx3,5FU)  . SCC (squamous cell carcinoma) 04/21/2014   Left Forearm (tx p bx)  . Superficial basal cell carcinoma (BCC) 02/03/2013   Left Preauricular (Cx3,5FU)  . Superficial nodular basal cell carcinoma (BCC) 03/21/2016   Left Upperarm (Cx3,5FU)  . Syncope     Past Surgical History:  Procedure Laterality Date  . CORONARY ARTERY  BYPASS GRAFT  1996  . HERNIA REPAIR    . LAPAROSCOPIC APPENDECTOMY    . MITRAL VALVE REPAIR    . SKIN CANCER EXCISION      Current Outpatient Medications  Medication Sig Dispense Refill  . ALPRAZolam (XANAX) 0.5 MG tablet Take 0.5 mg by mouth at bedtime as needed for sleep.     Marland Kitchen aspirin 81 MG tablet Take 1 tablet (81 mg total) by mouth daily.    . clopidogrel (PLAVIX) 75 MG tablet Take 1 tablet (75 mg total) by mouth daily. 30 tablet 0  . hydrochlorothiazide (HYDRODIURIL) 25 MG tablet Take 1 tablet by mouth daily.     Marland Kitchen levothyroxine (SYNTHROID, LEVOTHROID) 75 MCG tablet Take 75 mcg by mouth daily.    . meclizine (ANTIVERT) 25 MG tablet Take 1 tablet (25 mg total) by mouth 3 (three) times daily as needed for dizziness. 15 tablet 0  . metoprolol succinate (TOPROL-XL) 50 MG 24 hr tablet Take 50 mg by mouth at bedtime. Take with or immediately following a meal.    . nitroGLYCERIN (NITROSTAT) 0.4 MG SL tablet Place 1 tablet (0.4 mg total) under the tongue as directed. 25 tablet 3  . rosuvastatin (CRESTOR) 40 MG tablet Take 20 mg by mouth at bedtime.     . tamsulosin (FLOMAX) 0.4 MG CAPS capsule Take 1 capsule by mouth daily as needed.     No current facility-administered medications for this visit.   Allergies:  Patient has no known  allergies.   ROS:   No palpitations or syncope.  Physical Exam: VS:  BP 136/74   Pulse 78   Temp (!) 97.2 F (36.2 C)   Ht 5\' 11"  (1.803 m)   Wt 215 lb (97.5 kg)   SpO2 98%   BMI 29.99 kg/m , BMI Body mass index is 29.99 kg/m.  Wt Readings from Last 3 Encounters:  03/17/20 215 lb (97.5 kg)  09/02/19 215 lb (97.5 kg)  02/23/19 200 lb (90.7 kg)    General: Elderly male, appears comfortable at rest. HEENT: Conjunctiva and lids normal, wearing a mask. Neck: Supple, no elevated JVP or carotid bruits, no thyromegaly. Lungs: Clear to auscultation, nonlabored breathing at rest. Cardiac: Regular rate and rhythm with ectopy, no S3, 2/6 systolic  murmur. Abdomen: Soft, nontender, bowel sounds present. Extremities: No pitting edema, distal pulses 2+. Skin: Warm and dry. Musculoskeletal: No kyphosis. Neuropsychiatric: Alert and oriented x3, affect grossly appropriate.  ECG:  An ECG dated 02/23/2019 was personally reviewed today and demonstrated:  Sinus rhythm with right bundle branch block and possible old inferolateral infarct pattern.  Recent Labwork:  August 2020: Hemoglobin 15.5, platelets 199, BUN 19, creatinine 1.05, potassium 3.9, AST 25, ALT 26, cholesterol 146, triglycerides 144, HDL 38, LDL 83 (previously 69), TSH 3.96  Other Studies Reviewed Today:  Echocardiogram 05/20/2019: 1. The left ventricle has normal systolic function, with an ejection fraction of 55-60%. The cavity size was normal. There is mildly increased left ventricular wall thickness. Left ventricular diastolic Doppler parameters are consistent with impaired  relaxation. No evidence of left ventricular regional wall motion abnormalities. 2. The right ventricle has normal systolic function. The cavity was normal. There is no increase in right ventricular wall thickness. Right ventricular systolic pressure normal with an estimated pressure of 12.1 mmHg. 3. A ring annuloplasty valve is present in the mitral position. Mean gradient 6 mmHg and calculated valve area consistent with mild mitral stenosis. There is trivial mitral regurgitation. 4. The aortic valve is tricuspid. Mild thickening of the aortic valve. Mild calcification of the aortic valve. Aortic valve regurgitation is mild by color flow Doppler. Mild aortic annular calcification noted. 5. The tricuspid valve is grossly normal. 6. The aortic root is normal in size and structure.  Lexiscan Cardiolite 09/09/3015:  There was no ST segment deviation noted during stress.  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  Nuclear stress EF:  59%.  Overall low to intermediate risk study  Assessment and Plan:  1.  Multivessel CAD status post CABG in 1996 with graft disease based on follow-up ischemic testing.  We will continue medical therapy and observation in the absence of accelerating angina.  Refill provided for fresh bottle of nitroglycerin.  Continue aspirin, Plavix, Toprol-XL, and Crestor.  2.  Mixed hyperlipidemia, remains on high-dose Crestor.  LDL increased to 83 since prior assessment, likely related to liberalization of diet per discussion with patient.  He will cut back on sweets and fats.  3.  Valvular heart disease status post previous mitral annuloplasty.  Echocardiogram from June of last year reviewed, only trivial mitral regurgitation noted, and mild stenosis with mean gradient 6 mmHg.  Medication Adjustments/Labs and Tests Ordered: Current medicines are reviewed at length with the patient today.  Concerns regarding medicines are outlined above.   Tests Ordered: Orders Placed This Encounter  Procedures  . EKG 12-Lead    Medication Changes: Meds ordered this encounter  Medications  . nitroGLYCERIN (NITROSTAT) 0.4  MG SL tablet    Sig: Place 1 tablet (0.4 mg total) under the tongue as directed.    Dispense:  25 tablet    Refill:  3    Disposition:  Follow up 6 months in the [facility] office.  Signed, Satira Sark, MD, [facility] 03/17/2020 9:09 AM    [facility]. [address], [place], [place] [address] Phone: [phone]; Fax: [phone]

## 2020-03-17 NOTE — Addendum Note (Signed)
Addended by: Debbora Lacrosse R on: 03/17/2020 09:17 AM   Modules accepted: Orders

## 2020-03-17 NOTE — Patient Instructions (Signed)
Medication Instructions:  Your physician recommends that you continue on your current medications as directed. Please refer to the Current Medication list given to you today.   I refilled your NTG    *If you need a refill on your cardiac medications before your next appointment, please call your pharmacy*   Lab Work: None today If you have labs (blood work) drawn today and your tests are completely normal, you will receive your results only by: . MyChart Message (if you have MyChart) OR . A paper copy in the mail If you have any lab test that is abnormal or we need to change your treatment, we will call you to review the results.   Testing/Procedures: None today   Follow-Up: At [facility], you and your health needs are our priority.  As part of our continuing mission to provide you with exceptional heart care, we have created designated Provider Care Teams.  These Care Teams include your primary Cardiologist (physician) and Advanced Practice Providers (APPs -  Physician Assistants and Nurse Practitioners) who all work together to provide you with the care you need, when you need it.  We recommend signing up for the patient portal called "MyChart".  Sign up information is provided on this After Visit Summary.  MyChart is used to connect with patients for Virtual Visits (Telemedicine).  Patients are able to view lab/test results, encounter notes, upcoming appointments, etc.  Non-urgent messages can be sent to your provider as well.   To learn more about what you can do with MyChart, go to [email or web address].    Your next appointment:   6 month(s)  The format for your next appointment:   In Person  Provider:   Samuel McDowell, MD   Other Instructions None      Thank you for choosing [facility] !         

## 2020-08-16 ENCOUNTER — Ambulatory Visit (INDEPENDENT_AMBULATORY_CARE_PROVIDER_SITE_OTHER): Payer: Medicare Other | Admitting: Dermatology

## 2020-08-16 ENCOUNTER — Encounter: Payer: Self-pay | Admitting: Dermatology

## 2020-08-16 ENCOUNTER — Other Ambulatory Visit: Payer: Self-pay

## 2020-08-16 DIAGNOSIS — D225 Melanocytic nevi of trunk: Secondary | ICD-10-CM

## 2020-08-16 DIAGNOSIS — Z1283 Encounter for screening for malignant neoplasm of skin: Secondary | ICD-10-CM | POA: Diagnosis not present

## 2020-08-16 DIAGNOSIS — D229 Melanocytic nevi, unspecified: Secondary | ICD-10-CM

## 2020-08-16 DIAGNOSIS — Z85820 Personal history of malignant melanoma of skin: Secondary | ICD-10-CM | POA: Diagnosis not present

## 2020-08-16 DIAGNOSIS — I25119 Atherosclerotic heart disease of native coronary artery with unspecified angina pectoris: Secondary | ICD-10-CM

## 2020-08-16 DIAGNOSIS — D485 Neoplasm of uncertain behavior of skin: Secondary | ICD-10-CM

## 2020-08-16 DIAGNOSIS — L570 Actinic keratosis: Secondary | ICD-10-CM | POA: Diagnosis not present

## 2020-08-16 DIAGNOSIS — L821 Other seborrheic keratosis: Secondary | ICD-10-CM

## 2020-08-16 NOTE — Patient Instructions (Addendum)
Biopsy, Surgery (Curettage) & Surgery (Excision) Aftercare Instructions  1. Okay to remove bandage in 24 hours  2. Wash area with soap and water  3. Apply Vaseline to area twice daily until healed (Not Neosporin)  4. Okay to cover with a Band-Aid to decrease the chance of infection or prevent irritation from clothing; also it's okay to uncover lesion at home.  5. Suture instructions: return to our office in 7-10 or 10-14 days for a nurse visit for suture removal. Variable healing with sutures, if pain or itching occurs call our office. It's okay to shower or bathe 24 hours after sutures are given.  6. The following risks may occur after a biopsy, curettage or excision: bleeding, scarring, discoloration, recurrence, infection (redness, yellow drainage, pain or swelling).  7. For questions, concerns and results call our office at [place] before 4pm & Friday before 3pm. Biopsy results will be available in 1 week.  Routine follow-up and general skin check Matthew Williams Summer date of birth 1946-08-01.  Matthew Williams has noticed some crusted areas on the right arm and right ear.  The 2 larger spots are possible nonmelanoma skin cancers and shave biopsies obtained.  If Matthew Williams would call the office in 2 to 3 days we will review the results.  Smaller crusts on the lower right forearm represents solar keratoses or precancer and was was treated with 5-second liquid nitrogen freeze.  This may swell and peel over the next 2 weeks.  There is no sign of recurrent melanoma nor of any new atypical moles.  Matthew Williams has many dozen benign seborrheic keratoses particularly on the anterior chest.  These do not require watching her removal.  If neither biopsy shows anything that requires further work, routine follow-up in 1 year.

## 2020-08-16 NOTE — Progress Notes (Signed)
   ALSO ST LN2 RIGHT ARM

## 2020-08-18 ENCOUNTER — Telehealth: Payer: Self-pay | Admitting: *Deleted

## 2020-08-18 ENCOUNTER — Encounter: Payer: Self-pay | Admitting: *Deleted

## 2020-08-18 NOTE — Telephone Encounter (Signed)
Path to patient. Made surgery appointment with Dr.Tafeen.  

## 2020-09-09 ENCOUNTER — Encounter: Payer: Self-pay | Admitting: Dermatology

## 2020-09-09 NOTE — Progress Notes (Signed)
Follow-Up Visit   Subjective  Matthew Williams is a 74 y.o. male who presents for the following: Annual Exam (RIGHT NECK LARGE ISK?).  General skin check Location:  Duration:  Quality:  Associated Signs/Symptoms: Modifying Factors:  Severity:  Timing: Context:   Objective  Well appearing patient in no apparent distress; mood and affect are within normal limits.  A focused examination was performed including face, neck, chest and back and Legs plus all skin waist up.. Relevant physical exam findings are noted in the Assessment and Plan.   Assessment & Plan   Routine follow-up and general skin check Perle Gibbon date of birth [birth year].  He has noticed some crusted areas on the right arm and right ear.  The 2 larger spots are possible nonmelanoma skin cancers and shave biopsies obtained.  If Ronalee Belts would call the office in 2 to 3 days we will review the results.  Smaller crusts on the lower right forearm represents solar keratoses or precancer and was was treated with 5-second liquid nitrogen freeze.  This may swell and peel over the next 2 weeks.  There is no sign of recurrent melanoma nor of any new atypical moles.  He has many dozen benign seborrheic keratoses particularly on the anterior chest.  These do not require watching her removal.  If neither biopsy shows anything that requires further work, routine follow-up in 1 year. Neoplasm of uncertain behavior of skin (2) Right Postauricular Area  Skin / nail biopsy Type of biopsy: tangential   Informed consent: discussed and consent obtained   Timeout: patient name, date of birth, surgical site, and procedure verified   Procedure prep:  Patient was prepped and draped in usual sterile fashion Prep type:  Chlorhexidine Anesthesia: the lesion was anesthetized in a standard fashion   Anesthetic:  1% lidocaine w/ epinephrine 1-100,000 local infiltration Instrument used: flexible razor blade   Hemostasis achieved with:  ferric subsulfate   Outcome: patient tolerated procedure well   Post-procedure details: wound care instructions given    Specimen 1 - Surgical pathology Differential Diagnosis: BCC SCC Check Margins: No  Right Forearm - Posterior  Skin / nail biopsy Type of biopsy: tangential   Informed consent: discussed and consent obtained   Timeout: patient name, date of birth, surgical site, and procedure verified   Procedure prep:  Patient was prepped and draped in usual sterile fashion Prep type:  Chlorhexidine Anesthesia: the lesion was anesthetized in a standard fashion   Anesthetic:  1% lidocaine w/ epinephrine 1-100,000 local infiltration Instrument used: flexible razor blade   Hemostasis achieved with: ferric subsulfate   Outcome: patient tolerated procedure well   Post-procedure details: wound care instructions given    Specimen 2 - Surgical pathology Differential Diagnosis: BCC SCC Check Margins: No  AK (actinic keratosis) (2) Right Forearm - Posterior  Destruction of lesion - Right Forearm - Posterior Complexity: simple   Destruction method: cryotherapy   Informed consent: discussed and consent obtained   Timeout:  patient name, date of birth, surgical site, and procedure verified Lesion destroyed using liquid nitrogen: Yes   Region frozen until ice ball extended beyond lesion: Yes   Cryotherapy cycles:  5 Outcome: patient tolerated procedure well with no complications    Personal history of malignant melanoma of skin Dorsum of Nose  General skin examination annually.  Sooner if Ronalee Belts sees any clinical changes in pigmented spots.  Nevus Mid Back  Annual skin examination  Seborrheic keratosis Chest - Medial (  Center)  Leave if stable  Routine follow-up and general skin check Matthew Williams date of birth 1946/03/25.  He has noticed some crusted areas on the right arm and right ear.  The 2 larger spots are possible nonmelanoma skin cancers and shave  biopsies obtained.  If Ronalee Belts would call the office in 2 to 3 days we will review the results.  Smaller crusts on the lower right forearm represents solar keratoses or precancer and was was treated with 5-second liquid nitrogen freeze.  This may swell and peel over the next 2 weeks.  There is no sign of recurrent melanoma nor of any new atypical moles.  He has many dozen benign seborrheic keratoses particularly on the anterior chest.  These do not require watching her removal.  If neither biopsy shows anything that requires further work, routine follow-up in 1 year.   I, Lavonna Monarch, MD, have reviewed all documentation for this visit.  The documentation on 09/09/20 for the exam, diagnosis, procedures, and orders are all accurate and complete.

## 2020-09-14 ENCOUNTER — Encounter: Payer: Self-pay | Admitting: Cardiology

## 2020-09-14 ENCOUNTER — Ambulatory Visit (INDEPENDENT_AMBULATORY_CARE_PROVIDER_SITE_OTHER): Payer: Medicare Other | Admitting: Cardiology

## 2020-09-14 ENCOUNTER — Other Ambulatory Visit: Payer: Self-pay

## 2020-09-14 VITALS — BP 140/68 | HR 76 | Ht 71.0 in | Wt 221.2 lb

## 2020-09-14 DIAGNOSIS — E782 Mixed hyperlipidemia: Secondary | ICD-10-CM | POA: Diagnosis not present

## 2020-09-14 DIAGNOSIS — I25119 Atherosclerotic heart disease of native coronary artery with unspecified angina pectoris: Secondary | ICD-10-CM

## 2020-09-14 NOTE — Progress Notes (Signed)
Cardiology Office Note  Date: 09/14/2020   ID: Matthew, [name] [birth year], MRN [id number]  PCP:  Matthew Rim, MD  Cardiologist:  Matthew Lesches, MD Electrophysiologist:  None   Chief Complaint  Patient presents with  . Cardiac follow-up    History of Present Illness: Matthew Williams is a 74 y.o. male last seen in April.  He presents for a follow-up visit.  Reports no angina symptoms or nitroglycerin use.  He has gained some weight, admits that he was not as active over the hot and humid summer, but plans to get back to regular walking and still anticipates his trip out [place] to hunt later this year.  I reviewed his recent lab work as noted below.  LDL down to 70.  I reviewed his medications which are outlined below and stable from a cardiac perspective.  ECG from April is noted below.  Past Medical History:  Diagnosis Date  . BCC (basal cell carcinoma of skin) 04/04/2009   Left Nostril (MOH's)  . BCC (basal cell carcinoma of skin) 04/23/2011   Left Preauricular (Cx3,5FU)  . Coronary atherosclerosis of native coronary artery    Multivessel status post CABG  . Essential hypertension   . Hyperlipidemia   . Hypothyroidism   . 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 SCC (squamous cell carcinoma) 02/03/2013   Right Neck-Well Diff (Cx3,5FU)  . SCC (squamous cell carcinoma) 04/21/2014   Left Forearm (tx p bx)  . SCC (squamous cell carcinoma) 08/16/2020   right forearm posterior   . SCC (squamous cell carcinoma) 08/16/2020   right post auricular (in situ)  . Superficial basal cell carcinoma (BCC) 02/03/2013   Left Preauricular (Cx3,5FU)  . Superficial nodular basal cell carcinoma (BCC) 03/21/2016   Left Upperarm (Cx3,5FU)  . Syncope     Past Surgical History:  Procedure Laterality Date  . CORONARY ARTERY BYPASS GRAFT  1996  . HERNIA REPAIR    . LAPAROSCOPIC  APPENDECTOMY    . MITRAL VALVE REPAIR    . SKIN CANCER EXCISION      Current Outpatient Medications  Medication Sig Dispense Refill  . ALPRAZolam (XANAX) 0.5 MG tablet Take 0.5 mg by mouth at bedtime as needed for sleep.     Marland Kitchen aspirin 81 MG tablet Take 1 tablet (81 mg total) by mouth daily.    . clopidogrel (PLAVIX) 75 MG tablet Take 1 tablet (75 mg total) by mouth daily. 30 tablet 0  . hydrochlorothiazide (HYDRODIURIL) 25 MG tablet Take 1 tablet by mouth daily.     Marland Kitchen levothyroxine (SYNTHROID, LEVOTHROID) 75 MCG tablet Take 75 mcg by mouth daily.    . meclizine (ANTIVERT) 25 MG tablet Take 1 tablet (25 mg total) by mouth 3 (three) times daily as needed for dizziness. 15 tablet 0  . metoprolol succinate (TOPROL-XL) 50 MG 24 hr tablet Take 50 mg by mouth at bedtime. Take with or immediately following a meal.    . nitroGLYCERIN (NITROSTAT) 0.4 MG SL tablet Place 1 tablet (0.4 mg total) under the tongue as directed. 25 tablet 3  . rosuvastatin (CRESTOR) 40 MG tablet Take 20 mg by mouth at bedtime.     . tamsulosin (FLOMAX) 0.4 MG CAPS capsule Take 1 capsule by mouth daily as needed.     No current facility-administered medications for this visit.   Allergies:  Patient has no known allergies.  ROS:  No palpitations or syncope.  Physical Exam: VS:  BP 140/68   Pulse 76   Ht 5\' 11"  (1.803 m)   Wt 221 lb 3.2 oz (100.3 kg)   SpO2 97%   BMI 30.85 kg/m , BMI Body mass index is 30.85 kg/m.  Wt Readings from Last 3 Encounters:  09/14/20 221 lb 3.2 oz (100.3 kg)  03/17/20 215 lb (97.5 kg)  09/02/19 215 lb (97.5 kg)    General: Elderly male, appears comfortable at rest. HEENT: Conjunctiva and lids normal, wearing a mask. Neck: Supple, no elevated JVP or carotid bruits, no thyromegaly. Lungs: Clear to auscultation, nonlabored breathing at rest. Cardiac: Regular rate and rhythm, no S3, 2/6 systolic murmur, no pericardial rub. Extremities: No pitting edema, distal pulses 2+.  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  Recent Labwork:  September 2021: BUN 14, creatinine 0.99, potassium 4.3, AST 22, ALT 19, cholesterol 131, triglycerides 146, HDL 35, LDL 70, TSH 2.8, hemoglobin 15.5, platelets 199  Other Studies Reviewed Today:  Echocardiogram 05/20/2019: 1. The left ventricle has normal systolic function, with an ejection fraction of 55-60%. The cavity size was normal. There is mildly increased left ventricular wall thickness. Left ventricular diastolic Doppler parameters are consistent with impaired  relaxation. No evidence of left ventricular regional wall motion abnormalities. 2. The right ventricle has normal systolic function. The cavity was normal. There is no increase in right ventricular wall thickness. Right ventricular systolic pressure normal with an estimated pressure of 12.1 mmHg. 3. A ring annuloplasty valve is present in the mitral position. Mean gradient 6 mmHg and calculated valve area consistent with mild mitral stenosis. There is trivial mitral regurgitation. 4. The aortic valve is tricuspid. Mild thickening of the aortic valve. Mild calcification of the aortic valve. Aortic valve regurgitation is mild by color flow Doppler. Mild aortic annular calcification noted. 5. The tricuspid valve is grossly normal. 6. The aortic root is normal in size and structure.  Lexiscan Cardiolite 09/09/3015:  There was no ST segment deviation noted during stress.  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  Nuclear stress EF: 59%.  Overall low to intermediate risk study  Assessment and Plan:  1.  CAD status post CABG in 1996.  He has graft disease based on follow-up ischemic testing that has been managed conservatively in the absence of accelerating angina  per discussion of her time.  Still reports reasonable exercise tolerance and continues on aspirin, Plavix, Toprol-XL, Crestor, and as needed nitroglycerin.  2.  Mixed hyperlipidemia, recent LDL 70 on Crestor.  No changes were made today.  3.  History of mitral annuloplasty with trivial mitral regurgitation and mild stenosis by echocardiogram last year.  No change on examination.  Continue observation.  Medication Adjustments/Labs and Tests Ordered: Current medicines are reviewed at length with the patient today.  Concerns regarding medicines are outlined above.   Tests Ordered: No orders of the defined types were placed in this encounter.   Medication Changes: No orders of the defined types were placed in this encounter.   Disposition:  Follow up 6 months in the [place] office.  Signed, Satira Sark, MD, [facility] 09/14/2020 2:51 PM    [place] [facility] [address], [place], [place] [address] Phone: [phone]; Fax: [phone]

## 2020-09-14 NOTE — Patient Instructions (Signed)

## 2020-09-29 ENCOUNTER — Encounter: Payer: Self-pay | Admitting: Dermatology

## 2020-09-29 ENCOUNTER — Ambulatory Visit (INDEPENDENT_AMBULATORY_CARE_PROVIDER_SITE_OTHER): Payer: Medicare Other | Admitting: Dermatology

## 2020-09-29 ENCOUNTER — Other Ambulatory Visit: Payer: Self-pay

## 2020-09-29 DIAGNOSIS — C44222 Squamous cell carcinoma of skin of right ear and external auricular canal: Secondary | ICD-10-CM | POA: Diagnosis not present

## 2020-09-29 DIAGNOSIS — D0461 Carcinoma in situ of skin of right upper limb, including shoulder: Secondary | ICD-10-CM

## 2020-09-29 DIAGNOSIS — C4492 Squamous cell carcinoma of skin, unspecified: Secondary | ICD-10-CM

## 2020-09-29 DIAGNOSIS — D049 Carcinoma in situ of skin, unspecified: Secondary | ICD-10-CM

## 2020-09-29 NOTE — Patient Instructions (Signed)

## 2020-10-17 NOTE — Addendum Note (Signed)
Addended by: Ailene Rud on: 10/17/2020 03:19 PM   Modules accepted: Orders

## 2020-10-21 ENCOUNTER — Telehealth: Payer: Self-pay | Admitting: *Deleted

## 2020-10-21 NOTE — Telephone Encounter (Signed)
-----   Message from Lavonna Monarch, MD sent at 10/21/2020  6:09 AM EDT ----- Schedule 30 minutes with Dr. Darene Lamer; decided at that time which lesions will be treated.

## 2020-10-21 NOTE — Telephone Encounter (Signed)
Phone call to patient to give report but patient has already had spots treated. They are doing well no concerns.

## 2020-11-06 NOTE — Progress Notes (Signed)
Cardiology Office Note  Date: 11/07/2020   ID: Matthew Williams April 15, 1946, MRN [id number]  PCP:  Curly Rim, MD  Cardiologist:  Rozann Lesches, MD Electrophysiologist:  None   Chief Complaint: CAD, HLD,  History of Present Illness: Matthew Williams is a 74 y.o. male with a history of CAD, HLD, hypothyroidism. History of syncope.  Last seen by Dr. Domenic Polite 09/14/2020. Reported no anginal symptoms or nitroglycerin use. Had gained some weight and stated he was not as active over the summer. Last LDL was 70. He was continuing Plavix, aspirin, Toprol-XL, Crestor and as needed nitroglycerin. History of mitral annuloplasty with trivial MR and mild stenosis by echo in 2020. No change on exam and continuing observation.  Patient is here for with recent complaints of increased dyspnea on exertion.  He states [place] and recently has had significant issues with shortness of breath when walking usually occurring 1 walking up an incline or more than usual activity.  He states this is unusual from him.  He has a history of mitral annuloplasty in the past.  He denies ever having smoked.  Denies any recent respiratory infections.  He has had the Covid virus in the past and is wondering if there are any long-term side effects of the infection.  He states he notices occasional palpitations but nothing bothersome.  Denies any orthostatic symptoms, CVA or TIA-like symptoms, PND, orthopnea, denies any anginal symptoms but DOE could be an anginal equivalent.  States he recently had some lab work at his primary care provider's office this year but is not aware of the results.  He is concerned this may be his heart since he has had prior surgical repair.  Past Medical History:  Diagnosis Date  . BCC (basal cell carcinoma of skin) 04/04/2009   Left Nostril (MOH's)  . BCC (basal cell carcinoma of skin) 04/23/2011   Left Preauricular (Cx3,5FU)  . Coronary  atherosclerosis of native coronary artery    Multivessel status post CABG  . Essential hypertension   . Hyperlipidemia   . Hypothyroidism   . 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 SCC (squamous cell carcinoma) 02/03/2013   Right Neck-Well Diff (Cx3,5FU)  . SCC (squamous cell carcinoma) 04/21/2014   Left Forearm (tx p bx)  . SCC (squamous cell carcinoma) 08/16/2020   right forearm posterior   . SCC (squamous cell carcinoma) 08/16/2020   right post auricular (in situ)  . Superficial basal cell carcinoma (BCC) 02/03/2013   Left Preauricular (Cx3,5FU)  . Superficial nodular basal cell carcinoma (BCC) 03/21/2016   Left Upperarm (Cx3,5FU)  . Syncope     Past Surgical History:  Procedure Laterality Date  . CORONARY ARTERY BYPASS GRAFT  1996  . HERNIA REPAIR    . LAPAROSCOPIC APPENDECTOMY    . MITRAL VALVE REPAIR    . SKIN CANCER EXCISION      Current Outpatient Medications  Medication Sig Dispense Refill  . ALPRAZolam (XANAX) 0.5 MG tablet Take 0.5 mg by mouth at bedtime as needed for sleep.     Marland Kitchen aspirin 81 MG tablet Take 1 tablet (81 mg total) by mouth daily.    . clopidogrel (PLAVIX) 75 MG tablet Take 1 tablet (75 mg total) by mouth daily. 30 tablet 0  . hydrochlorothiazide (HYDRODIURIL) 25 MG tablet Take 1 tablet by mouth daily.     Marland Kitchen  levothyroxine (SYNTHROID, LEVOTHROID) 75 MCG tablet Take 75 mcg by mouth daily.    . meclizine (ANTIVERT) 25 MG tablet Take 1 tablet (25 mg total) by mouth 3 (three) times daily as needed for dizziness. 15 tablet 0  . metoprolol succinate (TOPROL-XL) 50 MG 24 hr tablet Take 50 mg by mouth at bedtime. Take with or immediately following a meal.    . nitroGLYCERIN (NITROSTAT) 0.4 MG SL tablet Place 1 tablet (0.4 mg total) under the tongue as directed. 25 tablet 3  . rosuvastatin (CRESTOR) 40 MG tablet Take 20 mg by mouth at bedtime.     . tamsulosin (FLOMAX)  0.4 MG CAPS capsule Take 1 capsule by mouth daily as needed.     No current facility-administered medications for this visit.   Allergies:  Patient has no known allergies.   Social History: The patient  reports that he has never smoked. He has never used smokeless tobacco. He reports current alcohol use. He reports that he does not use drugs.   Family History: The patient's family history includes Heart attack in his brother.   ROS:  Please see the history of present illness. Otherwise, complete review of systems is positive for none.  All other systems are reviewed and negative.   Physical Exam: VS:  BP 118/80   Pulse 98   Ht 5\' 11"  (1.803 m)   Wt 221 lb 9.6 oz (100.5 kg)   SpO2 97%   BMI 30.91 kg/m , BMI Body mass index is 30.91 kg/m.  Wt Readings from Last 3 Encounters:  11/07/20 221 lb 9.6 oz (100.5 kg)  09/14/20 221 lb 3.2 oz (100.3 kg)  03/17/20 215 lb (97.5 kg)    General: Patient appears comfortable at rest. Neck: Supple, no elevated JVP or carotid bruits, no thyromegaly. Lungs: Clear to auscultation, nonlabored breathing at rest. Cardiac: Regular rate and rhythm, no S3 or significant systolic murmur, no pericardial rub. Abdomen: Soft, nontender, no hepatomegaly, bowel sounds present, no guarding or rebound. Extremities: No pitting edema, distal pulses 2+. Skin: Warm and dry. Musculoskeletal: No kyphosis. Neuropsychiatric: Alert and oriented x3, affect grossly appropriate.  ECG:  EKG March 17, 2020 showed sinus rhythm with possible premature atrial complexes of right bundle branch block plus right ventricular enlargement, left posterior fascicular/bifascicular block.  Heart rate is 66.  Recent Labwork: No results found for requested labs within last 8760 hours.     Component Value Date/Time   CHOL 187 09/16/2015 0535   TRIG 195 (H) 09/16/2015 0535   HDL 26 (L) 09/16/2015 0535   CHOLHDL 7.2 09/16/2015 0535   VLDL 39 09/16/2015 0535   LDLCALC 122 (H) 09/16/2015  0535   LDLDIRECT 91.3 04/10/2007 0839    Other Studies Reviewed Today:   Echocardiogram 05/20/2019: 1. The left ventricle has normal systolic function, with an ejection fraction of 55-60%. The cavity size was normal. There is mildly increased left ventricular wall thickness. Left ventricular diastolic Doppler parameters are consistent with impaired  relaxation. No evidence of left ventricular regional wall motion abnormalities. 2. The right ventricle has normal systolic function. The cavity was normal. There is no increase in right ventricular wall thickness. Right ventricular systolic pressure normal with an estimated pressure of 12.1 mmHg. 3. A ring annuloplasty valve is present in the mitral position. Mean gradient 6 mmHg and calculated valve area consistent with mild mitral stenosis. There is trivial mitral regurgitation. 4. The aortic valve is tricuspid. Mild thickening of the aortic valve. Mild calcification of the  aortic valve. Aortic valve regurgitation is mild by color flow Doppler. Mild aortic annular calcification noted. 5. The tricuspid valve is grossly normal. 6. The aortic root is normal in size and structure.  Lexiscan Cardiolite 09/09/3015:  There was no ST segment deviation noted during stress.  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  Nuclear stress EF: 59%.  Overall low to intermediate risk study   Assessment and Plan:  1. CAD in native artery   2. Mixed hyperlipidemia   3. Status post mitral valve annuloplasty   4. DOE (dyspnea on exertion)   5. Anginal equivalent ([place])    1. CAD in native artery History of CABG 1998.  Denies any classic anginal symptoms such as chest pain, pressure, tightness, neck, arm, back, or jaw pain.  Denies any associated nausea, vomiting, or diaphoresis upon exertion.  Primary complaint is increased dyspnea on exertion which could be an anginal equivalent.   Continue aspirin 81 mg daily.  Continue Plavix 75 mg daily.  Continue nitroglycerin sublingual as needed.  Please schedule a Lexiscan stress test.  2. Mixed hyperlipidemia Continue Crestor 40 mg daily.  Please obtain recent PCP results from annual wellness visit.  3. Status post mitral valve annuloplasty History of mitral valve annuloplasty.  Echo  May 20, 2019 demonstrated ring annuloplasty valve present in mitral position mean gradient 6 mmHg calculated valve area consistent with mild mitral stenosis.  Trivial MR.  4.  DOE Patient notes increasing dyspnea on exertion which seems to be worsening.  He is a Chief Technology Officer and [place]  He states recently during bird hunting he has been noticing increasing shortness of breath which seems to be progressing.  Denies any significant weight gain or edema.  Please get a repeat echocardiogram  Medication Adjustments/Labs and Tests Ordered: Current medicines are reviewed at length with the patient today.  Concerns regarding medicines are outlined above.   Disposition: Follow-up with Dr. Domenic Polite or APP 4 to 6 weeks.  Signed, Levell July, NP 11/07/2020 9:17 AM    [facility] [place], [place], [place] [address] Phone: [phone]; Fax: [phone]

## 2020-11-07 ENCOUNTER — Encounter: Payer: Self-pay | Admitting: *Deleted

## 2020-11-07 ENCOUNTER — Ambulatory Visit (INDEPENDENT_AMBULATORY_CARE_PROVIDER_SITE_OTHER): Payer: Medicare Other | Admitting: Family Medicine

## 2020-11-07 ENCOUNTER — Encounter: Payer: Self-pay | Admitting: Family Medicine

## 2020-11-07 VITALS — BP 118/80 | HR 98 | Ht 71.0 in | Wt 221.6 lb

## 2020-11-07 DIAGNOSIS — R0609 Other forms of dyspnea: Secondary | ICD-10-CM

## 2020-11-07 DIAGNOSIS — I2510 Atherosclerotic heart disease of native coronary artery without angina pectoris: Secondary | ICD-10-CM | POA: Diagnosis not present

## 2020-11-07 DIAGNOSIS — I208 Other forms of angina pectoris: Secondary | ICD-10-CM

## 2020-11-07 DIAGNOSIS — R0600 Dyspnea, unspecified: Secondary | ICD-10-CM

## 2020-11-07 DIAGNOSIS — Z9889 Other specified postprocedural states: Secondary | ICD-10-CM

## 2020-11-07 DIAGNOSIS — E782 Mixed hyperlipidemia: Secondary | ICD-10-CM

## 2020-11-07 NOTE — Patient Instructions (Signed)
Medication Instructions:  Continue all current medications.  Labwork: none  Testing/Procedures:  Your physician has requested that you have an echocardiogram. Echocardiography is a painless test that uses sound waves to create images of your heart. It provides your doctor with information about the size and shape of your heart and how well your heart's chambers and valves are working. This procedure takes approximately one hour. There are no restrictions for this procedure.  Your physician has requested that you have a lexiscan myoview. For further information please visit [email or web address]. Please follow instruction sheet, as given.  Office will contact with results via phone or letter.    Follow-Up: 4-6 weeks   Any Other Special Instructions Will Be Listed Below (If Applicable).  If you need a refill on your cardiac medications before your next appointment, please call your pharmacy.

## 2020-11-16 ENCOUNTER — Encounter (HOSPITAL_BASED_OUTPATIENT_CLINIC_OR_DEPARTMENT_OTHER)
Admission: RE | Admit: 2020-11-16 | Discharge: 2020-11-16 | Disposition: A | Discharge status: Home / Self Care | Payer: Medicare Other | Source: Ambulatory Visit | Attending: Family Medicine | Admitting: Family Medicine

## 2020-11-16 ENCOUNTER — Other Ambulatory Visit: Payer: Self-pay

## 2020-11-16 ENCOUNTER — Encounter (HOSPITAL_COMMUNITY): Payer: Self-pay

## 2020-11-16 ENCOUNTER — Encounter (HOSPITAL_COMMUNITY)
Admission: RE | Admit: 2020-11-16 | Discharge: 2020-11-16 | Disposition: A | Discharge status: Home / Self Care | Payer: Medicare Other | Source: Ambulatory Visit | Attending: Family Medicine | Admitting: Family Medicine

## 2020-11-16 DIAGNOSIS — R0600 Dyspnea, unspecified: Secondary | ICD-10-CM

## 2020-11-16 DIAGNOSIS — I208 Other forms of angina pectoris: Secondary | ICD-10-CM

## 2020-11-16 DIAGNOSIS — R0609 Other forms of dyspnea: Secondary | ICD-10-CM

## 2020-11-16 LAB — NM MYOCAR MULTI W/SPECT W/WALL MOTION / EF
LV dias vol: 75 mL (ref 62–150)
LV sys vol: 32 mL
Peak HR: 103 {beats}/min
RATE: 0.42
Rest HR: 95 {beats}/min
SDS: 1
SRS: 4
SSS: 5
TID: 1.04

## 2020-11-16 MED ORDER — TECHNETIUM TC 99M TETROFOSMIN IV KIT
30.0000 | PACK | Freq: Once | INTRAVENOUS | Status: AC | PRN
Start: 2020-11-16 — End: 2020-11-16
  Administered 2020-11-16: 32 via INTRAVENOUS

## 2020-11-16 MED ORDER — SODIUM CHLORIDE FLUSH 0.9 % IV SOLN
INTRAVENOUS | Status: AC
  Administered 2020-11-16: 10 mL via INTRAVENOUS
  Filled 2020-11-16: qty 10

## 2020-11-16 MED ORDER — TECHNETIUM TC 99M TETROFOSMIN IV KIT
10.0000 | PACK | Freq: Once | INTRAVENOUS | Status: AC | PRN
  Administered 2020-11-16: 10.3 via INTRAVENOUS

## 2020-11-16 MED ORDER — REGADENOSON 0.4 MG/5ML IV SOLN
INTRAVENOUS | Status: AC
  Administered 2020-11-16: 0.4 mg via INTRAVENOUS
  Filled 2020-11-16: qty 5

## 2020-11-22 ENCOUNTER — Telehealth: Payer: Self-pay | Admitting: *Deleted

## 2020-11-22 NOTE — Telephone Encounter (Signed)
-----   Message from Massie Maroon, Adrian sent at 11/21/2020 11:12 AM EST -----  ----- Message ----- From: Verta Ellen., NP Sent: 11/16/2020   6:48 PM EST To: Laurine Blazer, LPN  Please call the patient and let him know the stress test did no show any significant evidence of lack blood flow through the coronary arteries. The Dr's report classifies it as a low risk study. Thank You

## 2020-11-22 NOTE — Telephone Encounter (Signed)
Pt voiced understanding

## 2020-12-05 ENCOUNTER — Ambulatory Visit (INDEPENDENT_AMBULATORY_CARE_PROVIDER_SITE_OTHER): Payer: Medicare Other

## 2020-12-05 DIAGNOSIS — R0609 Other forms of dyspnea: Secondary | ICD-10-CM

## 2020-12-05 DIAGNOSIS — R0600 Dyspnea, unspecified: Secondary | ICD-10-CM | POA: Diagnosis not present

## 2020-12-05 LAB — ECHOCARDIOGRAM COMPLETE
Area-P 1/2: 5.54 cm2
Calc EF: 41.6 %
MV VTI: 1.11 cm2
S' Lateral: 2.92 cm
Single Plane A2C EF: 41.5 %
Single Plane A4C EF: 40.5 %

## 2020-12-06 NOTE — Progress Notes (Signed)
Cardiology Office Note  Date: 12/07/2020   ID: Harlow, Carrizales 07-Dec-1946, MRN [id number]  PCP:  Curly Rim, MD  Cardiologist:  Rozann Lesches, MD Electrophysiologist:  None   Chief Complaint: CAD, HLD,  History of Present Illness: Matthew Williams is a 74 y.o. male with a history of CAD, HLD, hypothyroidism. History of syncope.  Last seen by Dr. Domenic Polite 09/14/2020. Reported no anginal symptoms or nitroglycerin use. Had gained some weight and stated he was not as active over the summer. Last LDL was 70. He was continuing Plavix, aspirin, Toprol-XL, Crestor and as needed nitroglycerin. History of mitral annuloplasty with trivial MR and mild stenosis by echo in 2020. No change on exam and continuing observation.  Patient is here for with recent complaints of increased dyspnea on exertion.  He states [place] and recently has had significant issues with shortness of breath when walking usually occurring 1 walking up an incline or more than usual activity.  He states this is unusual from him.  He has a history of mitral annuloplasty in the past.  He denies ever having smoked.  Denies any recent respiratory infections.  He has had the Covid virus in the past and is wondering if there are any long-term side effects of the infection.  He states he notices occasional palpitations but nothing bothersome.  Denies any orthostatic symptoms, CVA or TIA-like symptoms, PND, orthopnea, denies any anginal symptoms but DOE could be an anginal equivalent.  States he recently had some lab work at his primary care provider's office this year but is not aware of the results.  He is concerned this may be his heart since he has had prior surgical repair.  He is here today for follow-up for recent echocardiogram and stress test results.  We reviewed the results of the stress test and echocardiogram and he verbalizes understanding.  He states his symptoms have  improved some.  He is using a nasal spray which he states has improved his symptoms some.  He denies any classic anginal or exertional symptoms.  States he just has some exercise intolerance and not able to last as long when he is attempting to hunt.  States he does a lot of walking when he is hunting out in the [place].  He denies any palpitations or arrhythmias but does state his heart speeds up when he is doing a fair amount of walking.  Denies any orthostatic symptoms, CVA or TIA-like symptoms, PND, orthopnea, lower extremity edema, claudication, DVT or PE-like symptoms.  He states he has gained some weight and has not been quite as active as he has been in the past.   Past Medical History:  Diagnosis Date  . BCC (basal cell carcinoma of skin) 04/04/2009   Left Nostril (MOH's)  . BCC (basal cell carcinoma of skin) 04/23/2011   Left Preauricular (Cx3,5FU)  . Coronary atherosclerosis of native coronary artery    Multivessel status post CABG  . Essential hypertension   . Hyperlipidemia   . Hypothyroidism   . 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 SCC (squamous cell carcinoma) 02/03/2013   Right Neck-Well Diff (Cx3,5FU)  . SCC (squamous cell carcinoma) 04/21/2014   Left Forearm (tx p bx)  . SCC (squamous cell carcinoma) 08/16/2020   right forearm posterior   . SCC (squamous cell carcinoma) 08/16/2020  right post auricular (in situ)  . Superficial basal cell carcinoma (BCC) 02/03/2013   Left Preauricular (Cx3,5FU)  . Superficial nodular basal cell carcinoma (BCC) 03/21/2016   Left Upperarm (Cx3,5FU)  . Syncope     Past Surgical History:  Procedure Laterality Date  . CORONARY ARTERY BYPASS GRAFT  1996  . HERNIA REPAIR    . LAPAROSCOPIC APPENDECTOMY    . MITRAL VALVE REPAIR    . SKIN CANCER EXCISION      Current Outpatient Medications  Medication Sig Dispense Refill  . ALPRAZolam  (XANAX) 0.5 MG tablet Take 0.5 mg by mouth at bedtime as needed for sleep.     Marland Kitchen aspirin 81 MG tablet Take 1 tablet (81 mg total) by mouth daily.    . clopidogrel (PLAVIX) 75 MG tablet Take 1 tablet (75 mg total) by mouth daily. 30 tablet 0  . hydrochlorothiazide (HYDRODIURIL) 25 MG tablet Take 1 tablet by mouth daily.     Marland Kitchen levothyroxine (SYNTHROID, LEVOTHROID) 75 MCG tablet Take 75 mcg by mouth daily.    . meclizine (ANTIVERT) 25 MG tablet Take 1 tablet (25 mg total) by mouth 3 (three) times daily as needed for dizziness. 15 tablet 0  . metoprolol succinate (TOPROL-XL) 50 MG 24 hr tablet Take 50 mg by mouth at bedtime. Take with or immediately following a meal.    . nitroGLYCERIN (NITROSTAT) 0.4 MG SL tablet Place 1 tablet (0.4 mg total) under the tongue as directed. 25 tablet 3  . rosuvastatin (CRESTOR) 40 MG tablet Take 20 mg by mouth at bedtime.     . tamsulosin (FLOMAX) 0.4 MG CAPS capsule Take 1 capsule by mouth daily as needed.    . isosorbide mononitrate (IMDUR) 30 MG 24 hr tablet Take 1 tablet (30 mg total) by mouth daily. 90 tablet 3   No current facility-administered medications for this visit.   Allergies:  Patient has no known allergies.   Social History: The patient  reports that he has never smoked. He has never used smokeless tobacco. He reports current alcohol use. He reports that he does not use drugs.   Family History: The patient's family history includes Heart attack in his brother.   ROS:  Please see the history of present illness. Otherwise, complete review of systems is positive for none.  All other systems are reviewed and negative.   Physical Exam: VS:  BP 130/80   Pulse 95   Ht 5\' 11"  (1.803 m)   Wt 222 lb 9.6 oz (101 kg)   SpO2 93%   BMI 31.05 kg/m , BMI Body mass index is 31.05 kg/m.  Wt Readings from Last 3 Encounters:  12/07/20 222 lb 9.6 oz (101 kg)  11/07/20 221 lb 9.6 oz (100.5 kg)  09/14/20 221 lb 3.2 oz (100.3 kg)    General: Patient appears  comfortable at rest. Neck: Supple, no elevated JVP or carotid bruits, no thyromegaly. Lungs: Clear to auscultation, nonlabored breathing at rest. Cardiac: Regular rate and rhythm, no S3 or significant systolic murmur, no pericardial rub. Extremities: No pitting edema, distal pulses 2+. Skin: Warm and dry. Musculoskeletal: No kyphosis. Neuropsychiatric: Alert and oriented x3, affect grossly appropriate.  ECG:  EKG March 17, 2020 showed sinus rhythm with possible premature atrial complexes of right bundle branch block plus right ventricular enlargement, left posterior fascicular/bifascicular block.  Heart rate is 66.  Recent Labwork: No results found for requested labs within last 8760 hours.     Component Value Date/Time   CHOL  187 09/16/2015 0535   TRIG 195 (H) 09/16/2015 0535   HDL 26 (L) 09/16/2015 0535   CHOLHDL 7.2 09/16/2015 0535   VLDL 39 09/16/2015 0535   LDLCALC 122 (H) 09/16/2015 0535   LDLDIRECT 91.3 04/10/2007 0839    Other Studies Reviewed Today:  Nuclear stress test 11/16/2020  Narrative & Impression   No diagnostic ST segment changes to indicate ischemia. Frequent PVCs.  Small, mild intensity, anterior apical defect that is partially reversible and consistent with a minor ischemic territory. There is also a small, mild intensity, inferoapical defect that is partially reversible and consistent with mild ischemia. Mid to basal inferior defect is fixed and in the setting of normal wall motion likely represents soft tissue attenuation.  This is a low risk study.  Nuclear stress EF: 57%.      Echocardiogram 12/05/2020 1. Left ventricular ejection fraction, by estimation, is 50 to 55%. The left ventricle has low normal function. The left ventricle demonstrates regional wall motion abnormalities (see scoring diagram/findings for description). There is mild left ventricular hypertrophy. Left ventricular diastolic parameters are indeterminate. Both global longitudinal  strain and 3D LVEF measurements are incorrect and not reported. 2. Right ventricular systolic function is normal. The right ventricular size is normal. There is normal pulmonary artery systolic pressure. The estimated right ventricular systolic pressure is 23.5 mmHg. 3. The mitral valve has been repaired/replaced. Trivial mitral valve regurgitation. There is a prosthetic annuloplasty ring present in the mitral position. 4. The aortic valve is tricuspid. There is moderate calcification of the aortic valve with significantly restricted noncoronary cusp motion. Aortic valve regurgitation is not visualized. 5. The inferior vena cava is normal in size with greater than 50% respiratory variability, suggesting right atrial pressure of 3 mmHg. Comparison(s): Echocardiogram done 05/20/19 showed an EF of 55-60%.      Echocardiogram 05/20/2019: 1. The left ventricle has normal systolic function, with an ejection fraction of 55-60%. The cavity size was normal. There is mildly increased left ventricular wall thickness. Left ventricular diastolic Doppler parameters are consistent with impaired  relaxation. No evidence of left ventricular regional wall motion abnormalities. 2. The right ventricle has normal systolic function. The cavity was normal. There is no increase in right ventricular wall thickness. Right ventricular systolic pressure normal with an estimated pressure of 12.1 mmHg. 3. A ring annuloplasty valve is present in the mitral position. Mean gradient 6 mmHg and calculated valve area consistent with mild mitral stenosis. There is trivial mitral regurgitation. 4. The aortic valve is tricuspid. Mild thickening of the aortic valve. Mild calcification of the aortic valve. Aortic valve regurgitation is mild by color flow Doppler. Mild aortic annular calcification noted. 5. The tricuspid valve is grossly normal. 6. The aortic root is normal in size and structure.  Lexiscan Cardiolite  09/09/3015:  There was no ST segment deviation noted during stress.  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  Nuclear stress EF: 59%.  Overall low to intermediate risk study   Assessment and Plan:   1. CAD in native artery History of CABG 1998.  Denies any classic anginal symptoms such as chest pain, pressure, tightness, neck, arm, back, or jaw pain.  Denies any associated nausea, vomiting, or diaphoresis upon exertion.  Primary complaint is increased dyspnea on exertion which could be an anginal equivalent.  Continue aspirin 81 mg daily.  Continue Plavix 75 mg daily.  Continue nitroglycerin sublingual as needed.  Recent Lexiscan stress test was low risk.  We will try low-dose Imdur 30 mg daily.  Continue aspirin 81 mg daily, Plavix 75 mg daily   2. Mixed hyperlipidemia Continue Crestor 40 mg daily.    3. Status post mitral valve annuloplasty History of mitral valve annuloplasty.  Echo  May 20, 2019 demonstrated ring annuloplasty valve present in mitral position mean gradient 6 mmHg calculated valve area consistent with mild mitral stenosis.  Trivial MR.  4.  DOE States DOE has improved some since last visit.Recent echocardiogram demonstrated EF of 50 to 55%.  LV demonstrates R WMA's, mild LVH, trivial MR.  Presence of prosthetic annuloplasty ring in mitral position.  5.  Hypertension Blood pressure well controlled today with blood pressure 130/80.  Continue HCTZ 25 mg daily.  Continue Toprol-XL 50 mg daily.  Medication Adjustments/Labs and Tests Ordered: Current medicines are reviewed at length with the patient today.  Concerns regarding medicines are outlined above.   Disposition: Follow-up with Dr. Domenic Polite or APP 6 months  Signed, Levell July, NP 12/07/2020 9:00 AM    [facility] [place], [place], [place] [address] Phone: [phone]; Fax: [phone]

## 2020-12-07 ENCOUNTER — Encounter: Payer: Self-pay | Admitting: Family Medicine

## 2020-12-07 ENCOUNTER — Ambulatory Visit (INDEPENDENT_AMBULATORY_CARE_PROVIDER_SITE_OTHER): Payer: Medicare Other | Admitting: Family Medicine

## 2020-12-07 VITALS — BP 130/80 | HR 95 | Ht 71.0 in | Wt 222.6 lb

## 2020-12-07 DIAGNOSIS — I2510 Atherosclerotic heart disease of native coronary artery without angina pectoris: Secondary | ICD-10-CM

## 2020-12-07 DIAGNOSIS — R0609 Other forms of dyspnea: Secondary | ICD-10-CM

## 2020-12-07 DIAGNOSIS — R0600 Dyspnea, unspecified: Secondary | ICD-10-CM | POA: Diagnosis not present

## 2020-12-07 DIAGNOSIS — Z9889 Other specified postprocedural states: Secondary | ICD-10-CM

## 2020-12-07 DIAGNOSIS — E782 Mixed hyperlipidemia: Secondary | ICD-10-CM | POA: Diagnosis not present

## 2020-12-07 DIAGNOSIS — I10 Essential (primary) hypertension: Secondary | ICD-10-CM

## 2020-12-07 MED ORDER — ISOSORBIDE MONONITRATE ER 30 MG PO TB24: 30.0000 mg | ORAL_TABLET | Freq: Every day | ORAL | 3 refills | Status: DC

## 2020-12-07 NOTE — Patient Instructions (Addendum)
Medication Instructions:   Your physician has recommended you make the following change in your medication:   Start isosorbide mononitrate 30 mg daily in the evening. Please start with 1/2 tablet daily-then increase to whole tablet  Continue other medications the same  Labwork:  None  Testing/Procedures:  None  Follow-Up:  Your physician recommends that you schedule a follow-up appointment in: 6 months.   Any Other Special Instructions Will Be Listed Below (If Applicable).  If you need a refill on your cardiac medications before your next appointment, please call your pharmacy.

## 2021-01-04 ENCOUNTER — Encounter: Payer: Self-pay | Admitting: Dermatology

## 2021-01-04 NOTE — Progress Notes (Signed)
   Follow-Up Visit   Subjective  Matthew Williams is a 75 y.o. male who presents for the following: Procedure (SCC x 2 --- right postauricular area and right forearm posterior).  Nonmelanoma skin cancers Location: Right ear and forearm Duration:  Quality:  Associated Signs/Symptoms: Modifying Factors:  Severity:  Timing: Context: For treatment  Objective  Well appearing patient in no apparent distress; mood and affect are within normal limits. Objective  Right superior helix of the ear: Biopsy site identified by patient, nurse, and me.  Objective  Right Forearm - Posterior: Biopsy site identified by patient, nurse, and me.    A focused examination was performed including Head, neck, arms.. Relevant physical exam findings are noted in the Assessment and Plan.   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  Destruction of lesion Complexity: simple   Destruction method: electrodesiccation and curettage   Informed consent: discussed and consent obtained   Timeout:  patient name, date of birth, surgical site, and procedure verified Anesthesia: the lesion was anesthetized in a standard fashion   Anesthetic:  1% lidocaine w/ epinephrine 1-100,000 local infiltration Curettage performed in three different directions: Yes   Curettage cycles:  3 Lesion length (cm):  2 Lesion width (cm):  2 Margin per side (cm):  0 Final wound size (cm):  2 Hemostasis achieved with:  ferric subsulfate Outcome: patient tolerated procedure well with no complications   Additional details:  Wound innoculated with 5 fluorouracil solution.  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  Destruction of lesion Complexity: simple   Destruction method: electrodesiccation and curettage   Informed consent: discussed and consent obtained   Timeout:  patient name, date of birth, surgical site, and procedure verified Anesthesia: the  lesion was anesthetized in a standard fashion   Anesthetic:  1% lidocaine w/ epinephrine 1-100,000 local infiltration Curettage performed in three different directions: Yes   Lesion length (cm):  1.1 Lesion width (cm):  1.1 Margin per side (cm):  0 Final wound size (cm):  1.1 Hemostasis achieved with:  ferric subsulfate Outcome: patient tolerated procedure well with no complications   Additional details:  Wound innoculated with 5 fluorouracil solution.      I, Lavonna Monarch, MD, have reviewed all documentation for this visit.  The documentation on 01/04/21 for the exam, diagnosis, procedures, and orders are all accurate and complete.

## 2021-01-05 ENCOUNTER — Encounter: Payer: Self-pay | Admitting: *Deleted

## 2021-09-04 NOTE — Progress Notes (Signed)
Cardiology Office Note  Date: 09/05/2021   ID: Williams, Matthew Jan 13, 1946, MRN [id number]  PCP:  Matthew Rim, MD  Cardiologist:  Matthew Lesches, MD Electrophysiologist:  None   Chief Complaint  Patient presents with   Cardiac follow-up     History of Present Illness: Matthew Williams is a 75 y.o. male last seen in December 2021 by Mr. Matthew Sake NP.  He is here for a routine visit.  [place].  Last year he was more short of breath, states that he feels fine at this point and underwent follow-up ischemic testing in December 2021 that was low risk.  He does not report any active angina or nitroglycerin use.  Of note, he did not tolerate Imdur, states that it made him feel lightheaded.  I reviewed his recent lab work as noted below.  His LDL was 67 on Crestor.  We went over the remainder of his medications.  I personally reviewed his ECG today which shows sinus rhythm with probable left atrial enlargement and right bundle branch block.  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   Left Preauricular (MOH's)   SCC (squamous cell carcinoma) 02/03/2013   Right Neck-Well Diff (Cx3,5FU)   SCC (squamous cell carcinoma) 04/21/2014   Left Forearm (tx p bx)   SCC (squamous cell carcinoma) 08/16/2020   right forearm posterior    SCC (squamous cell carcinoma) 08/16/2020   right post auricular (in situ)   Superficial basal cell carcinoma (BCC) 02/03/2013   Left Preauricular (Cx3,5FU)   Superficial nodular basal cell carcinoma (BCC) 03/21/2016   Left  Upperarm (Cx3,5FU)   Syncope     Past Surgical History:  Procedure Laterality Date   CORONARY ARTERY BYPASS GRAFT  1996   HERNIA REPAIR     LAPAROSCOPIC APPENDECTOMY     MITRAL VALVE REPAIR     SKIN CANCER EXCISION      Current Outpatient Medications  Medication Sig Dispense Refill   ALPRAZolam (XANAX) 0.5 MG tablet Take 0.5 mg by mouth at bedtime as needed for sleep.      aspirin 81 MG tablet Take 1 tablet (81 mg total) by mouth daily.     clopidogrel (PLAVIX) 75 MG tablet Take 1 tablet (75 mg total) by mouth daily. 30 tablet 0   hydrochlorothiazide (HYDRODIURIL) 25 MG tablet Take 1 tablet by mouth daily.      levothyroxine (SYNTHROID, LEVOTHROID) 75 MCG tablet Take 75 mcg by mouth daily.     meclizine (ANTIVERT) 25 MG tablet Take 1 tablet (25 mg total) by mouth 3 (three) times daily as needed for dizziness. 15 tablet 0   metoprolol succinate (TOPROL-XL) 50 MG 24 hr tablet Take 50 mg by mouth at bedtime. Take with or immediately following a meal.     nitroGLYCERIN (NITROSTAT) 0.4 MG SL tablet Place 1 tablet (0.4 mg total) under the tongue as directed. 25 tablet 3   rosuvastatin (CRESTOR) 40 MG tablet Take 20 mg by mouth at bedtime.  tamsulosin (FLOMAX) 0.4 MG CAPS capsule Take 1 capsule by mouth daily as needed.     No current facility-administered medications for this visit.   Allergies:  Patient has no known allergies.   ROS: No palpitations or syncope.  Physical Exam: VS:  BP 126/68   Pulse 76   Ht '5\' 11"'$  (1.803 m)   Wt 223 lb (101.2 kg)   SpO2 97%   BMI 31.10 kg/m , BMI Body mass index is 31.1 kg/m.  Wt Readings from Last 3 Encounters:  09/05/21 223 lb (101.2 kg)  12/07/20 222 lb 9.6 oz (101 kg)  11/07/20 221 lb 9.6 oz (100.5 kg)    General: Elderly male, appears comfortable at rest. HEENT: Conjunctiva and lids normal, wearing a mask. Neck: Supple, no elevated JVP or carotid bruits, no thyromegaly. Lungs: Clear to auscultation, nonlabored breathing at  rest. Cardiac: Regular rate and rhythm, no S3, 2/6 systolic murmur, no pericardial rub. Extremities: No pitting edema.  ECG:  An ECG dated 03/17/2020 was personally reviewed today and demonstrated:  Sinus rhythm with right bundle branch block, left posterior fascicular block, old inferior infarct pattern, PVC.  Recent Labwork:  September 2022: Hemoglobin 14.5, platelets 188, BUN 13, creatinine 1.02, potassium 3.4, AST 22, ALT 22, cholesterol 126, triglycerides 131, HDL 36, LDL 67, TSH 2.48  Other Studies Reviewed Today:  Lexiscan Myoview 11/16/2020: No diagnostic ST segment changes to indicate ischemia. Frequent PVCs. Small, mild intensity, anterior apical defect that is partially reversible and consistent with a minor ischemic territory. There is also a small, mild intensity, inferoapical defect that is partially reversible and consistent with mild ischemia. Mid to basal inferior defect is fixed and in the setting of normal wall motion likely represents soft tissue attenuation. This is a low risk study. Nuclear stress EF: 57%.  Echocardiogram 12/05/2020:  1. Left ventricular ejection fraction, by estimation, is 50 to 55%. The  left ventricle has low normal function. The left ventricle demonstrates  regional wall motion abnormalities (see scoring diagram/findings for  description). There is mild left  ventricular hypertrophy. Left ventricular diastolic parameters are  indeterminate. Both global longitudinal strain and 3D LVEF measurements  are incorrect and not reported.   2. Right ventricular systolic function is normal. The right ventricular  size is normal. There is normal pulmonary artery systolic pressure. The  estimated right ventricular systolic pressure is 123XX123 mmHg.   3. The mitral valve has been repaired/replaced. Trivial mitral valve  regurgitation. There is a prosthetic annuloplasty ring present in the  mitral position.   4. The aortic valve is tricuspid. There is moderate  calcification of the  aortic valve with significantly restricted noncoronary cusp motion. Aortic  valve regurgitation is not visualized.   5. The inferior vena cava is normal in size with greater than 50%  respiratory variability, suggesting right atrial pressure of 3 mmHg.   Assessment and Plan:  1.  CAD status post CABG in 1996.  Follow-up [facility] December 2021 was low risk with small ischemic territories and normal LVEF.  He does not describe any active angina or nitroglycerin use at this time, ECG is stable.  Continue observation on aspirin, Plavix, Toprol-XL, Crestor, and as needed nitroglycerin.  2.  Mixed hyperlipidemia, doing well on high-dose Crestor.  Recent LDL 67.  3.  Previous history of mitral annuloplasty.  Echocardiogram from December 2021 revealed trivial mitral regurgitation with mitral prosthetic ring in place, normal estimated RVSP of 32 mmHg.  Medication Adjustments/Labs and Tests Ordered: Current  medicines are reviewed at length with the patient today.  Concerns regarding medicines are outlined above.   Tests Ordered: No orders of the defined types were placed in this encounter.   Medication Changes: No orders of the defined types were placed in this encounter.   Disposition:  Follow up  6 months.  Signed, Satira Sark, MD, [facility] 09/05/2021 1:17 PM    [place] at [facility] [address] [place],  [address] Phone: [phone]; Fax: [phone]

## 2021-09-05 ENCOUNTER — Encounter: Payer: Self-pay | Admitting: Cardiology

## 2021-09-05 ENCOUNTER — Other Ambulatory Visit: Payer: Self-pay

## 2021-09-05 ENCOUNTER — Ambulatory Visit (INDEPENDENT_AMBULATORY_CARE_PROVIDER_SITE_OTHER): Payer: Medicare Other | Admitting: Cardiology

## 2021-09-05 VITALS — BP 126/68 | HR 76 | Ht 71.0 in | Wt 223.0 lb

## 2021-09-05 DIAGNOSIS — I25119 Atherosclerotic heart disease of native coronary artery with unspecified angina pectoris: Secondary | ICD-10-CM | POA: Diagnosis not present

## 2021-09-05 DIAGNOSIS — E782 Mixed hyperlipidemia: Secondary | ICD-10-CM

## 2021-09-05 DIAGNOSIS — I059 Rheumatic mitral valve disease, unspecified: Secondary | ICD-10-CM | POA: Diagnosis not present

## 2021-09-05 NOTE — Addendum Note (Signed)
Addended by: Barbarann Ehlers A on: 09/05/2021 03:43 PM   Modules accepted: Orders

## 2021-09-05 NOTE — Patient Instructions (Signed)
Medication Instructions:  Your physician recommends that you continue on your current medications as directed. Please refer to the Current Medication list given to you today.  *If you need a refill on your cardiac medications before your next appointment, please call your pharmacy*   Lab Work: None today  If you have labs (blood work) drawn today and your tests are completely normal, you will receive your results only by: [place] (if you have MyChart) OR A paper copy in the mail If you have any lab test that is abnormal or we need to change your treatment, we will call you to review the results.   Testing/Procedures: None today    Follow-Up: At [facility], you and your health needs are our priority.  As part of our continuing mission to provide you with exceptional heart care, we have created designated Provider Care Teams.  These Care Teams include your primary Cardiologist (physician) and Advanced Practice Providers (APPs -  Physician Assistants and Nurse Practitioners) who all work together to provide you with the care you need, when you need it.  We recommend signing up for the patient portal called "MyChart".  Sign up information is provided on this After Visit Summary.  MyChart is used to connect with patients for Virtual Visits (Telemedicine).  Patients are able to view lab/test results, encounter notes, upcoming appointments, etc.  Non-urgent messages can be sent to your provider as well.   To learn more about what you can do with MyChart, go to [email or web address].    Your next appointment:   6 month(s)  The format for your next appointment:   In Person  Provider:      Other Instructions None

## 2022-03-08 ENCOUNTER — Ambulatory Visit: Payer: Medicare Other | Admitting: Cardiology

## 2022-04-17 ENCOUNTER — Encounter: Payer: Self-pay | Admitting: Cardiology

## 2022-04-17 ENCOUNTER — Ambulatory Visit (INDEPENDENT_AMBULATORY_CARE_PROVIDER_SITE_OTHER): Payer: Medicare Other | Admitting: Cardiology

## 2022-04-17 VITALS — BP 110/70 | HR 74 | Ht 71.0 in | Wt 232.4 lb

## 2022-04-17 DIAGNOSIS — I25119 Atherosclerotic heart disease of native coronary artery with unspecified angina pectoris: Secondary | ICD-10-CM | POA: Diagnosis not present

## 2022-04-17 DIAGNOSIS — Z9889 Other specified postprocedural states: Secondary | ICD-10-CM

## 2022-04-17 DIAGNOSIS — E782 Mixed hyperlipidemia: Secondary | ICD-10-CM | POA: Diagnosis not present

## 2022-04-17 NOTE — Progress Notes (Signed)
? ? ?Cardiology Office Note ? ?Date: 04/17/2022  ? ?ID: Matthew Williams, DOB 25-Jun-1946, MRN [id number] ? ?PCP:  Curly Rim, MD  ?Cardiologist:  Rozann Lesches, MD ?Electrophysiologist:  None  ? ?Chief Complaint  ?Patient presents with  ? Cardiac follow-up  ? ? ?History of Present Illness: ?Matthew Williams is a 76 y.o. male last seen in September 2022.  He is here for a follow-up visit.  Reports no angina symptoms on medical therapy, stamina is not what it used to be but he stays as active as he can, has to pace himself.  He did go out [place] to hunt with friends back in January. ? ?I went over his medications, he reports compliance with therapy.  No bleeding problems on aspirin and Plavix.  He is doing well on Crestor with last LDL 67. ? ?I reviewed his cardiac structural and ischemic testing from December 2021. ? ?Past Medical History:  ?Diagnosis Date  ? BCC (basal cell carcinoma of skin) 04/04/2009  ? Left Nostril (MOH's)  ? BCC (basal cell carcinoma of skin) 04/23/2011  ? Left Preauricular (Cx3,5FU)  ? Coronary atherosclerosis of native coronary artery   ? Multivessel status post CABG  ? Essential hypertension   ? Hyperlipidemia   ? Hypothyroidism   ? 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 Nodular basal cell carcinoma (BCC) 08/25/2013  ? Left Preauricular (MOH's)  ? SCC (squamous cell carcinoma) 02/03/2013  ? Right Neck-Well Diff (Cx3,5FU)  ? SCC (squamous cell carcinoma) 04/21/2014  ? Left Forearm (tx p bx)  ? SCC (squamous cell carcinoma) 08/16/2020  ? right forearm posterior   ? SCC (squamous cell carcinoma) 08/16/2020  ? right post auricular (in situ)  ? Superficial basal cell carcinoma (BCC) 02/03/2013  ? Left Preauricular (Cx3,5FU)  ? Superficial nodular basal cell carcinoma (BCC) 03/21/2016  ? Left Upperarm (Cx3,5FU)  ? Syncope   ? ? ?Past Surgical History:  ?Procedure Laterality Date  ? CORONARY ARTERY BYPASS GRAFT  1996  ? HERNIA REPAIR    ? LAPAROSCOPIC APPENDECTOMY    ? MITRAL  VALVE REPAIR    ? SKIN CANCER EXCISION    ? ? ?Current Outpatient Medications  ?Medication Sig Dispense Refill  ? ALPRAZolam (XANAX) 0.5 MG tablet Take 0.5 mg by mouth at bedtime as needed for sleep.     ? aspirin 81 MG tablet Take 1 tablet (81 mg total) by mouth daily.    ? clopidogrel (PLAVIX) 75 MG tablet Take 1 tablet (75 mg total) by mouth daily. 30 tablet 0  ? hydrochlorothiazide (HYDRODIURIL) 25 MG tablet Take 1 tablet by mouth daily.     ? levothyroxine (SYNTHROID, LEVOTHROID) 75 MCG tablet Take 75 mcg by mouth daily.    ? meclizine (ANTIVERT) 25 MG tablet Take 1 tablet (25 mg total) by mouth 3 (three) times daily as needed for dizziness. 15 tablet 0  ? metoprolol succinate (TOPROL-XL) 50 MG 24 hr tablet Take 50 mg by mouth at bedtime. Take with or immediately following a meal.    ? nitroGLYCERIN (NITROSTAT) 0.4 MG SL tablet Place 1 tablet (0.4 mg total) under the tongue as directed. 25 tablet 3  ? rosuvastatin (CRESTOR) 40 MG tablet Take 20 mg by mouth at bedtime.     ? tamsulosin (FLOMAX) 0.4 MG CAPS capsule Take 1 capsule by mouth daily as needed.    ? ?No current facility-administered medications for this visit.  ? ?Allergies:  Patient has no known allergies.  ? ?  ROS: No palpitations or syncope. ? ?Physical Exam: ?VS:  BP 110/70   Pulse 74   Ht '5\' 11"'$  (1.803 m)   Wt 232 lb 6.4 oz (105.4 kg)   SpO2 93%   BMI 32.41 kg/m? , BMI Body mass index is 32.41 kg/m?. ? ?Wt Readings from Last 3 Encounters:  ?04/17/22 232 lb 6.4 oz (105.4 kg)  ?09/05/21 223 lb (101.2 kg)  ?12/07/20 222 lb 9.6 oz (101 kg)  ?  ?General: Patient appears comfortable at rest. ?HEENT: Conjunctiva and lids normal. ?Neck: Supple, no elevated JVP or carotid bruits, no thyromegaly. ?Lungs: Clear to auscultation, nonlabored breathing at rest. ?Cardiac: Regular rate and rhythm, no S3, 2/6 systolic murmur, no pericardial rub. ?Extremities: No pitting edema. ? ?ECG:  An ECG dated 09/05/2021 was personally reviewed today and demonstrated:   Sinus rhythm with left atrial enlargement and right bundle branch block. ? ?Recent Labwork: ? ?September 2022: Hemoglobin 14.5, platelets 188, BUN 13, creatinine 1.02, potassium 3.4, AST 22, ALT 22, cholesterol 126, triglycerides 131, HDL 36, LDL 67, TSH 2.48 ? ?Other Studies Reviewed Today: ? ?Lexiscan Myoview 11/16/2020: ?No diagnostic ST segment changes to indicate ischemia. Frequent PVCs. ?Small, mild intensity, anterior apical defect that is partially reversible and consistent with a minor ischemic territory. There is also a small, mild intensity, inferoapical defect that is partially reversible and consistent with mild ischemia. Mid to basal inferior defect is fixed and in the setting of normal wall motion likely represents soft tissue attenuation. ?This is a low risk study. ?Nuclear stress EF: 57%. ?  ?Echocardiogram 12/05/2020: ? 1. Left ventricular ejection fraction, by estimation, is 50 to 55%. The  ?left ventricle has low normal function. The left ventricle demonstrates  ?regional wall motion abnormalities (see scoring diagram/findings for  ?description). There is mild left  ?ventricular hypertrophy. Left ventricular diastolic parameters are  ?indeterminate. Both global longitudinal strain and 3D LVEF measurements  ?are incorrect and not reported.  ? 2. Right ventricular systolic function is normal. The right ventricular  ?size is normal. There is normal pulmonary artery systolic pressure. The  ?estimated right ventricular systolic pressure is 34.1 mmHg.  ? 3. The mitral valve has been repaired/replaced. Trivial mitral valve  ?regurgitation. There is a prosthetic annuloplasty ring present in the  ?mitral position.  ? 4. The aortic valve is tricuspid. There is moderate calcification of the  ?aortic valve with significantly restricted noncoronary cusp motion. Aortic  ?valve regurgitation is not visualized.  ? 5. The inferior vena cava is normal in size with greater than 50%  ?respiratory variability,  suggesting right atrial pressure of 3 mmHg. ? ?Assessment and Plan: ? ?1.  CAD status post CABG in 1996.  We continue with medical therapy and observation in the absence of angina symptoms.  Last ischemic work-up was in December 2021, low risk.  LVEF 50 to 55%.  Continue aspirin, Plavix, Toprol-XL, Crestor, and as needed nitroglycerin. ? ?2.  History of mitral annuloplasty, stable by echocardiogram in December 2021. ? ?3.  Mixed hyperlipidemia on Crestor.  Last LDL 67. ? ?Medication Adjustments/Labs and Tests Ordered: ?Current medicines are reviewed at length with the patient today.  Concerns regarding medicines are outlined above.  ? ?Tests Ordered: ?No orders of the defined types were placed in this encounter. ? ? ?Medication Changes: ?No orders of the defined types were placed in this encounter. ? ? ?Disposition:  Follow up  6 months. ? ?Signed, ?Satira Sark, MD, [facility] ?04/17/2022 2:37 PM    ?  [place] at [facility] ?[place], [place], [place] [address] ?Phone: [phone]; Fax: [phone]  ?

## 2022-04-17 NOTE — Patient Instructions (Signed)

## 2022-11-05 ENCOUNTER — Encounter: Payer: Self-pay | Admitting: *Deleted

## 2022-11-11 NOTE — Progress Notes (Signed)
Cardiology Office Note  Date: 11/12/2022   ID: Matthew Williams June 01, 1946, MRN [id number]  PCP:  Curly Rim, MD  Cardiologist:  Rozann Lesches, MD Electrophysiologist:  None   Chief Complaint  Patient presents with   Cardiac follow-up    History of Present Illness: Matthew Williams is a 76 y.o. male last seen in May.  He is here for a routine visit.  Doing well without angina or nitroglycerin use.  Recently got back from a trip to [place] with friends to hunt.  States that he is breathing much better since treatment for sinusitis by PCP.  Presently NYHA class II dyspnea, no palpitations or syncope.  I reviewed his medications, we discussed stopping aspirin at this point.  He underwent cardiac structural and ischemic testing in 2021 as noted below.  I reviewed his interval lab work, most recent LDL was 66 in September.  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   Nodular basal cell carcinoma (BCC) 08/25/2013   Left Preauricular (MOH's)   SCC (squamous cell carcinoma) 02/03/2013   Right Neck-Well Diff (Cx3,5FU)   SCC (squamous cell carcinoma) 04/21/2014   Left Forearm (tx p bx)   SCC (squamous cell carcinoma) 08/16/2020   right forearm posterior    SCC (squamous cell carcinoma) 08/16/2020   right post auricular (in situ)   Superficial basal cell carcinoma (BCC) 02/03/2013   Left Preauricular (Cx3,5FU)   Superficial nodular basal cell carcinoma (BCC) 03/21/2016   Left Upperarm (Cx3,5FU)   Syncope     Past Surgical History:  Procedure Laterality Date   CORONARY ARTERY BYPASS GRAFT  1996   HERNIA REPAIR      LAPAROSCOPIC APPENDECTOMY     MITRAL VALVE REPAIR     SKIN CANCER EXCISION      Current Outpatient Medications  Medication Sig Dispense Refill   ALPRAZolam (XANAX) 0.5 MG tablet Take 0.5 mg by mouth at bedtime as needed for sleep.      clopidogrel (PLAVIX) 75 MG tablet Take 1 tablet (75 mg total) by mouth daily. 30 tablet 0   hydrochlorothiazide (HYDRODIURIL) 25 MG tablet Take 1 tablet by mouth daily.      levothyroxine (SYNTHROID, LEVOTHROID) 75 MCG tablet Take 75 mcg by mouth daily.     meclizine (ANTIVERT) 25 MG tablet Take 1 tablet (25 mg total) by mouth 3 (three) times daily as needed for dizziness. 15 tablet 0   metoprolol succinate (TOPROL-XL) 50 MG 24 hr tablet Take 50 mg by mouth at bedtime. Take with or immediately following a meal.     nitroGLYCERIN (NITROSTAT) 0.4 MG SL tablet Place 1 tablet (0.4 mg total) under the tongue as directed. 25 tablet 3   rosuvastatin (CRESTOR) 40 MG tablet Take 20 mg by mouth at bedtime.      tamsulosin (FLOMAX) 0.4 MG CAPS capsule Take 1 capsule by mouth daily as needed.     No current facility-administered medications for this visit.   Allergies:  Patient has no known allergies.   ROS: No orthopnea or PND.  Physical Exam: VS:  BP 128/84   Pulse  67   Ht '5\' 11"'$  (1.803 m)   Wt 220 lb (99.8 kg)   SpO2 97%   BMI 30.68 kg/m , BMI Body mass index is 30.68 kg/m.  Wt Readings from Last 3 Encounters:  11/12/22 220 lb (99.8 kg)  04/17/22 232 lb 6.4 oz (105.4 kg)  09/05/21 223 lb (101.2 kg)    General: Patient appears comfortable at rest. HEENT: Conjunctiva and lids normal. Neck: Supple, no elevated JVP or carotid bruits. Lungs: Clear to auscultation, nonlabored breathing at rest. Cardiac: Regular rate and rhythm, no S3, 2/6 systolic murmur. Abdomen: Soft, nontender, no hepatomegaly, bowel sounds present. Extremities: No pitting edema, distal pulses 2+.  ECG:  An ECG dated 09/05/2021 was personally reviewed today and demonstrated:  Sinus  rhythm with left atrial enlargement and left bundle branch block.  Recent Labwork:  September 2023: Hemoglobin 14.9, platelets 197, BUN 10, creatinine 1.06, potassium 3.8, AST 24, ALT 23, TSH 2.33, cholesterol 124, triglycerides 132, HDL 34, LDL 66  Other Studies Reviewed Today:  Lexiscan Myoview 11/16/2020: No diagnostic ST segment changes to indicate ischemia. Frequent PVCs. Small, mild intensity, anterior apical defect that is partially reversible and consistent with a minor ischemic territory. There is also a small, mild intensity, inferoapical defect that is partially reversible and consistent with mild ischemia. Mid to basal inferior defect is fixed and in the setting of normal wall motion likely represents soft tissue attenuation. This is a low risk study. Nuclear stress EF: 57%.   Echocardiogram 12/05/2020:  1. Left ventricular ejection fraction, by estimation, is 50 to 55%. The  left ventricle has low normal function. The left ventricle demonstrates  regional wall motion abnormalities (see scoring diagram/findings for  description). There is mild left  ventricular hypertrophy. Left ventricular diastolic parameters are  indeterminate. Both global longitudinal strain and 3D LVEF measurements  are incorrect and not reported.   2. Right ventricular systolic function is normal. The right ventricular  size is normal. There is normal pulmonary artery systolic pressure. The  estimated right ventricular systolic pressure is 23.7 mmHg.   3. The mitral valve has been repaired/replaced. Trivial mitral valve  regurgitation. There is a prosthetic annuloplasty ring present in the  mitral position.   4. The aortic valve is tricuspid. There is moderate calcification of the  aortic valve with significantly restricted noncoronary cusp motion. Aortic  valve regurgitation is not visualized.   5. The inferior vena cava is normal in size with greater than 50%  respiratory variability, suggesting  right atrial pressure of 3 mmHg.  High-resolution chest CT 05/04/2022 ([facility]): FINDINGS:   CABG changes. Advanced atherosclerotic disease of the thoracic aorta and native coronary arteries. No pericardial effusion. Mediastinal and right hilar adenopathy, unchanged.   Peripheral interstitial thickening, not significantly changed since the prior exam. No pleural effusion. No pneumothorax. No honeycombing. Mild central bronchiectasis, unchanged. No air trapping.   No acute osseous abnormality. The visualized portions of the upper abdomen are unremarkable.    IMPRESSION:  1. Stable fibrotic interstitial changes. No new abnormality.   Assessment and Plan:  1.  Multivessel CAD status post CABG in 1996.  He is doing well without angina or nitroglycerin use.  Ischemic testing from 2021 as noted above with plan to continue medical therapy.  Stopping aspirin at this point.  Continue Plavix, Toprol-XL, Crestor, and as needed nitroglycerin.  2.  History of mitral annuloplasty, clinically stable and with follow-up echocardiogram in 2021 showing only trivial mitral regurgitation.  3.  Mixed hyperlipidemia  on Crestor.  Recent LDL 66.  Medication Adjustments/Labs and Tests Ordered: Current medicines are reviewed at length with the patient today.  Concerns regarding medicines are outlined above.   Tests Ordered: No orders of the defined types were placed in this encounter.   Medication Changes: No orders of the defined types were placed in this encounter.   Disposition:  Follow up  6 months.  Signed, Satira Sark, MD, [facility] 11/12/2022 8:33 AM    [facility] [place], [place], [place] [address] Phone: [phone]; Fax: [phone]

## 2022-11-12 ENCOUNTER — Encounter: Payer: Self-pay | Admitting: Cardiology

## 2022-11-12 ENCOUNTER — Ambulatory Visit: Payer: Medicare Other | Attending: Cardiology | Admitting: Cardiology

## 2022-11-12 VITALS — BP 128/84 | HR 67 | Ht 71.0 in | Wt 220.0 lb

## 2022-11-12 DIAGNOSIS — I25119 Atherosclerotic heart disease of native coronary artery with unspecified angina pectoris: Secondary | ICD-10-CM | POA: Diagnosis present

## 2022-11-12 DIAGNOSIS — E782 Mixed hyperlipidemia: Secondary | ICD-10-CM | POA: Insufficient documentation

## 2022-11-12 DIAGNOSIS — Z9889 Other specified postprocedural states: Secondary | ICD-10-CM | POA: Insufficient documentation

## 2022-11-12 NOTE — Patient Instructions (Addendum)
Medication Instructions:  Your physician has recommended you make the following change in your medication:  Stop aspirin Continue other medications the same  Labwork: none  Testing/Procedures: none  Follow-Up: Your physician recommends that you schedule a follow-up appointment in: 6 months  Any Other Special Instructions Will Be Listed Below (If Applicable).  If you need a refill on your cardiac medications before your next appointment, please call your pharmacy.

## 2023-05-10 ENCOUNTER — Emergency Department (HOSPITAL_BASED_OUTPATIENT_CLINIC_OR_DEPARTMENT_OTHER)
Admission: EM | Admit: 2023-05-10 | Discharge: 2023-05-10 | Disposition: A | Discharge status: Home / Self Care | Payer: Medicare Other | Attending: Emergency Medicine | Admitting: Emergency Medicine

## 2023-05-10 ENCOUNTER — Emergency Department (HOSPITAL_BASED_OUTPATIENT_CLINIC_OR_DEPARTMENT_OTHER): Payer: Medicare Other | Admitting: Radiology

## 2023-05-10 ENCOUNTER — Other Ambulatory Visit: Payer: Self-pay

## 2023-05-10 ENCOUNTER — Encounter (HOSPITAL_BASED_OUTPATIENT_CLINIC_OR_DEPARTMENT_OTHER): Payer: Self-pay | Admitting: Emergency Medicine

## 2023-05-10 DIAGNOSIS — Z7902 Long term (current) use of antithrombotics/antiplatelets: Secondary | ICD-10-CM | POA: Insufficient documentation

## 2023-05-10 DIAGNOSIS — M62838 Other muscle spasm: Secondary | ICD-10-CM

## 2023-05-10 DIAGNOSIS — I2510 Atherosclerotic heart disease of native coronary artery without angina pectoris: Secondary | ICD-10-CM | POA: Diagnosis not present

## 2023-05-10 DIAGNOSIS — Z85828 Personal history of other malignant neoplasm of skin: Secondary | ICD-10-CM | POA: Diagnosis not present

## 2023-05-10 DIAGNOSIS — M79652 Pain in left thigh: Secondary | ICD-10-CM | POA: Diagnosis present

## 2023-05-10 DIAGNOSIS — Z79899 Other long term (current) drug therapy: Secondary | ICD-10-CM | POA: Insufficient documentation

## 2023-05-10 LAB — BASIC METABOLIC PANEL
Anion gap: 6 (ref 5–15)
BUN: 15 mg/dL (ref 8–23)
CO2: 31 mmol/L (ref 22–32)
Calcium: 8.8 mg/dL — ABNORMAL LOW (ref 8.9–10.3)
Chloride: 102 mmol/L (ref 98–111)
Creatinine, Ser: 0.98 mg/dL (ref 0.61–1.24)
GFR, Estimated: >60 mL/min (ref >60)
Glucose, Bld: 122 mg/dL — ABNORMAL HIGH (ref 70–99)
Potassium: 3.6 mmol/L (ref 3.5–5.1)
Sodium: 139 mmol/L (ref 135–145)

## 2023-05-10 LAB — CBC WITH DIFFERENTIAL/PLATELET
Abs Immature Granulocytes: 0.02 10*3/uL (ref 0.00–0.07)
Basophils Absolute: 0.1 10*3/uL (ref 0.0–0.1)
Basophils Relative: 1 %
Eosinophils Absolute: 0.4 10*3/uL (ref 0.0–0.5)
Eosinophils Relative: 4 %
HCT: 41.7 % (ref 39.0–52.0)
Hemoglobin: 13.5 g/dL (ref 13.0–17.0)
Immature Granulocytes: 0 %
Lymphocytes Relative: 32 %
Lymphs Abs: 2.9 10*3/uL (ref 0.7–4.0)
MCH: 28.9 pg (ref 26.0–34.0)
MCHC: 32.4 g/dL (ref 30.0–36.0)
MCV: 89.3 fL (ref 80.0–100.0)
Monocytes Absolute: 1 10*3/uL (ref 0.1–1.0)
Monocytes Relative: 12 %
Neutro Abs: 4.5 10*3/uL (ref 1.7–7.7)
Neutrophils Relative %: 51 %
Platelets: 167 10*3/uL (ref 150–400)
RBC: 4.67 MIL/uL (ref 4.22–5.81)
RDW: 14.1 % (ref 11.5–15.5)
WBC: 8.9 10*3/uL (ref 4.0–10.5)
nRBC: 0 % (ref 0.0–0.2)

## 2023-05-10 LAB — MAGNESIUM: Magnesium: 1.8 mg/dL (ref 1.7–2.4)

## 2023-05-10 MED ORDER — CYCLOBENZAPRINE HCL 10 MG PO TABS: 10.0000 mg | ORAL_TABLET | Freq: Two times a day (BID) | ORAL | 0 refills | Status: AC | PRN

## 2023-05-10 MED ORDER — SODIUM CHLORIDE 0.9 % IV BOLUS
500.0000 mL | Freq: Once | INTRAVENOUS | Status: AC
  Administered 2023-05-10: 500 mL via INTRAVENOUS

## 2023-05-10 MED ORDER — CYCLOBENZAPRINE HCL 5 MG PO TABS
5.0000 mg | ORAL_TABLET | Freq: Once | ORAL | Status: AC
  Administered 2023-05-10: 5 mg via ORAL
  Filled 2023-05-10: qty 1

## 2023-05-10 MED ORDER — SODIUM CHLORIDE 0.9 % IV BOLUS: 250.0000 mL | Freq: Once | INTRAVENOUS | Status: DC

## 2023-05-10 NOTE — ED Triage Notes (Signed)
Pt presents to ED POV. Pt c/o L leg pain x4-5d. Pt reports that he was sliding out of the bed and his leg got stuck in the sheet. When he was pulling it out he flet a pop in his groin area on the left. Has had pain since but worsened since last night.

## 2023-05-10 NOTE — Discharge Instructions (Signed)
Recommend 1000 mg of Tylenol every 6 hours as needed for pain.  Take Flexeril as prescribed for muscle relaxant.  Do not mix with alcohol or drugs or other dangerous activities including driving as this medication is mildly sedating.  Overall I think you have a pulled muscle in your leg.  Follow-up with your primary care doctor if you are not improving.

## 2023-05-10 NOTE — ED Notes (Signed)
Patient transported to X-ray 

## 2023-05-10 NOTE — ED Provider Notes (Signed)
[place] EMERGENCY DEPARTMENT AT [facility] Provider Note   CSN: [id number] Arrival date & time: 05/10/23  2139     History  Chief Complaint  Patient presents with   Leg Pain    Matthew Williams is a 77 y.o. male.  Patient here with left upper leg pain for the last for 5 days after he got his leg caught on the bedsheet.  He felt a little bit of a pull in his left upper groin area.  Is been having some spasms in his left upper thigh intermittently during this time.  Is making it hard for him to sleep.  Denies any nausea vomiting diarrhea.  No pain with urination.  No leg swelling.  No recent surgery or travel.  No history of blood clots.  Denies any fever or chills.  No redness or swelling.  The history is provided by the patient.       Home Medications Prior to Admission medications   Medication Sig Start Date End Date Taking? Authorizing Provider  cyclobenzaprine (FLEXERIL) 10 MG tablet Take 1 tablet (10 mg total) by mouth 2 (two) times daily as needed for muscle spasms. 05/10/23  Yes Briseis Aguilera, DO  ALPRAZolam (XANAX) 0.5 MG tablet Take 0.5 mg by mouth at bedtime as needed for sleep.     [provider]  clopidogrel (PLAVIX) 75 MG tablet Take 1 tablet (75 mg total) by mouth daily. 07/25/15   Standley Brooking, MD  hydrochlorothiazide (HYDRODIURIL) 25 MG tablet Take 1 tablet by mouth daily.  02/04/17   [provider]  levothyroxine (SYNTHROID, LEVOTHROID) 75 MCG tablet Take 75 mcg by mouth daily.    [provider]  meclizine (ANTIVERT) 25 MG tablet Take 1 tablet (25 mg total) by mouth 3 (three) times daily as needed for dizziness. 12/06/17   Doug Sou, MD  metoprolol succinate (TOPROL-XL) 50 MG 24 hr tablet Take 50 mg by mouth at bedtime. Take with or immediately following a meal.    [provider]  nitroGLYCERIN (NITROSTAT) 0.4 MG SL tablet Place 1 tablet (0.4 mg total) under the tongue as directed. 03/17/20   Jonelle Sidle, MD  rosuvastatin (CRESTOR) 40 MG tablet Take 20 mg by mouth at bedtime.     [provider]  tamsulosin (FLOMAX) 0.4 MG CAPS capsule Take 1 capsule by mouth daily as needed. 08/17/19   [provider]      Allergies    Patient has no known allergies.    Review of Systems   Review of Systems  Physical Exam Updated Vital Signs BP (!) 145/89 (BP Location: Right Arm)   Pulse 70   Temp 97.7 F (36.5 C) (Oral)   Resp 18   SpO2 95%  Physical Exam Vitals and nursing note reviewed.  Constitutional:      General: He is not in acute distress.    Appearance: He is well-developed. He is not ill-appearing.  HENT:     Head: Normocephalic and atraumatic.     Nose: Nose normal.     Mouth/Throat:     Mouth: Mucous membranes are moist.  Eyes:     Extraocular Movements: Extraocular movements intact.     Conjunctiva/sclera: Conjunctivae normal.     Pupils: Pupils are equal, round, and reactive to light.  Cardiovascular:     Rate and Rhythm: Normal rate and regular rhythm.     Pulses: Normal pulses.     Heart sounds: Normal heart sounds. No murmur  heard. Pulmonary:     Effort: Pulmonary effort is normal. No respiratory distress.     Breath sounds: Normal breath sounds.  Abdominal:     General: Abdomen is flat.     Palpations: Abdomen is soft.     Tenderness: There is no abdominal tenderness.  Musculoskeletal:        General: Tenderness present. No swelling.     Cervical back: Normal range of motion and neck supple.     Right lower leg: No edema.     Left lower leg: No edema.     Comments: Tenderness to the left upper groin area but good range of motion in the left hip, left knee  Skin:    General: Skin is warm and dry.     Capillary Refill: Capillary refill takes less than 2 seconds.  Neurological:     General: No focal deficit present.     Mental Status: He is alert.  Psychiatric:        Mood and Affect: Mood normal.     ED Results / Procedures /  Treatments   Labs (all labs ordered are listed, but only abnormal results are displayed) Labs Reviewed  BASIC METABOLIC PANEL - Abnormal; Notable for the following components:      Result Value   Glucose, Bld 122 (*)    Calcium 8.8 (*)    All other components within normal limits  CBC WITH DIFFERENTIAL/PLATELET  MAGNESIUM    EKG None  Radiology No results found.  Procedures Procedures    Medications Ordered in ED Medications  cyclobenzaprine (FLEXERIL) tablet 5 mg (5 mg Oral Given 05/10/23 2215)  sodium chloride 0.9 % bolus 500 mL (500 mLs Intravenous New Bag/Given 05/10/23 2208)    ED Course/ Medical Decision Making/ A&P                             Medical Decision Making Amount and/or Complexity of Data Reviewed Labs: ordered. Radiology: ordered.  Risk Prescription drug management.   Matthew Williams is here with left upper leg pain, spasms.  Normal vitals.  No fever.  Patient states that when he got out of bed a couple days ago he slid and got his leg stuck underneath him.  He felt a pull in his left groin.  He has had some pain and discomfort and spasms ever since.  Nothing makes it worse or better.  He is able to walk and do activities just fine but he will have episodes of feeling spasms.  No swelling or redness of the leg.  Have no concern for clot.  He has strong pulses.  No concern for arterial process.  Overall suspect muscle spasms in the setting of muscle pull.  Will get basic labs to look for any electrolyte abnormalities including magnesium.  Will give IV fluids.  Will give Flexeril.  Will get an x-ray of the left hip.  History of CAD, high cholesterol, squamous cell carcinoma.  Per my review and interpretation of labs and imaging there is no acute findings.  There is no electrolyte abnormality or kidney injury or leukocytosis or anemia.  X-ray with no signs of fracture or malalignment.  Overall he is feeling better after muscle relaxant.  Will prescribe the  same.  Recommend Tylenol.  Recommend follow-up with primary care doctor as needed.  Discharged in good condition.  This chart was dictated using voice recognition software.  Despite best efforts to  proofread,  errors can occur which can change the documentation meaning.         Final Clinical Impression(s) / ED Diagnoses Final diagnoses:  Muscle spasm    Rx / DC Orders ED Discharge Orders          Ordered    cyclobenzaprine (FLEXERIL) 10 MG tablet  2 times daily PRN        05/10/23 2246              Virgina Norfolk, DO 05/10/23 2248

## 2023-05-10 NOTE — ED Notes (Signed)
Reviewed AVS/discharge instruction with patient. Time allotted for and all questions answered. Patient is agreeable for d/c and escorted to ed exit by staff.  

## 2023-05-27 NOTE — Progress Notes (Unsigned)
    Cardiology Office Note  Date: 05/28/2023   ID: Matthew Williams, DOB 31-Jan-1946, MRN [id number]  History of Present Illness: Matthew Williams is a 77 y.o. male last seen in November 2023.  He is here for a routine visit.  Reports no angina or nitroglycerin use in the interim, NYHA class II dyspnea with typical activities, no palpitations or syncope.  He plans to go out to [place] in November for a hunting trip with his friends as usual.  I reviewed his medications, he reports compliance with therapy.  No intolerances on Crestor 40 mg daily and his LDL was 66 in September 2023.  ECG today shows sinus rhythm with right bundle branch block which is old.  Physical Exam: VS:  BP (!) 148/84   Pulse 73   Ht 5\' 11"  (1.803 m)   Wt 217 lb 9.6 oz (98.7 kg)   SpO2 98%   BMI 30.35 kg/m , BMI Body mass index is 30.35 kg/m.  Wt Readings from Last 3 Encounters:  05/28/23 217 lb 9.6 oz (98.7 kg)  11/12/22 220 lb (99.8 kg)  04/17/22 232 lb 6.4 oz (105.4 kg)    General: Patient appears comfortable at rest. HEENT: Conjunctiva and lids normal. Neck: Supple, no elevated JVP or carotid bruits. Lungs: Clear to auscultation, nonlabored breathing at rest. Cardiac: Regular rate and rhythm with ectopy, no S3, 2/6 systolic murmur. Extremities: No pitting edema, distal pulses 2+.  ECG:  An ECG dated May 2023 was personally reviewed today and demonstrated:  Sinus rhythm with right bundle branch block and PAC.  Labwork: September 2023: Cholesterol 124, triglycerides 132, HDL 34, LDL 66 05/10/2023: BUN 15; Creatinine, Ser 0.98; Hemoglobin 13.5; Magnesium 1.8; Platelets 167; Potassium 3.6; Sodium 139   Other Studies Reviewed Today:  No interval cardiac testing for review today.  Assessment and Plan:  1.  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  Echocardiogram in December 2021 revealed LVEF 50 to 55% with intact mitral annuloplasty and trivial mitral regurgitation.   Ischemic testing at that time was low risk showing a mild inferoapical ischemic territory that has been managed medically in the absence of angina.  He remains symptomatically stable and we will continue with medical therapy and observation for now.  Currently on Plavix, Toprol-XL, Crestor, and as needed nitroglycerin.  Most likely we will plan on an updated echocardiogram around time of his next visit.  2.  Essential hypertension.  3.  Mixed hyperlipidemia.  LDL 66 in September 2023 on Crestor.  Disposition:  Follow up  6 months.  Signed, Jonelle Sidle, M.D., F.A.C.C. [facility] at [facility]

## 2023-05-28 ENCOUNTER — Ambulatory Visit: Payer: Medicare Other | Attending: Cardiology | Admitting: Cardiology

## 2023-05-28 ENCOUNTER — Encounter: Payer: Self-pay | Admitting: Cardiology

## 2023-05-28 VITALS — BP 148/84 | HR 73 | Ht 71.0 in | Wt 217.6 lb

## 2023-05-28 DIAGNOSIS — Z9889 Other specified postprocedural states: Secondary | ICD-10-CM | POA: Insufficient documentation

## 2023-05-28 DIAGNOSIS — E782 Mixed hyperlipidemia: Secondary | ICD-10-CM | POA: Insufficient documentation

## 2023-05-28 DIAGNOSIS — I25119 Atherosclerotic heart disease of native coronary artery with unspecified angina pectoris: Secondary | ICD-10-CM | POA: Diagnosis present

## 2023-05-28 NOTE — Patient Instructions (Addendum)

## 2023-09-12 ENCOUNTER — Encounter: Payer: Self-pay | Admitting: Physician Assistant

## 2023-12-06 ENCOUNTER — Encounter: Payer: Self-pay | Admitting: Cardiology

## 2023-12-06 ENCOUNTER — Ambulatory Visit: Payer: Medicare Other | Attending: Cardiology | Admitting: Cardiology

## 2023-12-06 VITALS — BP 112/72 | HR 60 | Ht 71.0 in | Wt 220.4 lb

## 2023-12-06 DIAGNOSIS — I25119 Atherosclerotic heart disease of native coronary artery with unspecified angina pectoris: Secondary | ICD-10-CM | POA: Insufficient documentation

## 2023-12-06 DIAGNOSIS — I059 Rheumatic mitral valve disease, unspecified: Secondary | ICD-10-CM | POA: Insufficient documentation

## 2023-12-06 DIAGNOSIS — E782 Mixed hyperlipidemia: Secondary | ICD-10-CM | POA: Diagnosis not present

## 2023-12-06 DIAGNOSIS — Z9889 Other specified postprocedural states: Secondary | ICD-10-CM | POA: Diagnosis not present

## 2023-12-06 NOTE — Patient Instructions (Addendum)

## 2023-12-06 NOTE — Progress Notes (Signed)
    Cardiology Office Note  Date: 12/06/2023   ID: Jaamal, Sandra 1946-07-31, MRN [id number]  History of Present Illness: Matthew Williams is a 77 y.o. male last seen in June.  He is here for a routine visit.  Reports no angina or nitroglycerin use.  Generally NYHA class II dyspnea with routine activities.  Still enjoys hunting trips in [place], plans to go back in early January.  I reviewed his medications.  Current cardiac regimen includes Plavix, HCTZ, Toprol-XL, Crestor, and as needed nitroglycerin.  We discussed arranging a follow-up echocardiogram.  Last evaluation was in 2021.  Physical Exam: VS:  BP 112/72 (BP Location: Right Arm)   Pulse 60   Ht 5\' 11"  (1.803 m)   Wt 220 lb 6.4 oz (100 kg)   SpO2 98%   BMI 30.74 kg/m , BMI Body mass index is 30.74 kg/m.  Wt Readings from Last 3 Encounters:  12/06/23 220 lb 6.4 oz (100 kg)  05/28/23 217 lb 9.6 oz (98.7 kg)  11/12/22 220 lb (99.8 kg)    General: Patient appears comfortable at rest. HEENT: Conjunctiva and lids normal. Neck: Supple, no elevated JVP or carotid bruits. Lungs: Coarse breath sounds, no wheezing. Cardiac: Regular rate and rhythm, no S3, 2/6 systolic murmur. Extremities: Mild leg edema bilaterally.  ECG:  An ECG dated 05/28/2023 was personally reviewed today and demonstrated:  Sinus rhythm with right bundle branch block.  Labwork: 05/10/2023: BUN 15; Creatinine, Ser 0.98; Hemoglobin 13.5; Magnesium 1.8; Platelets 167; Potassium 3.6; Sodium 139  September 2024: TSH 2.04, hemoglobin 14.8, platelets 185, cholesterol 126, triglycerides 146, HDL 34, LDL 66, potassium 3.7, BUN 11, creatinine 0.97, AST 24, ALT 22  Other Studies Reviewed Today:  No interval cardiac testing for review today.  Assessment and Plan:  1.  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  Echocardiogram in December 2021 revealed LVEF 50 to 55% with intact mitral annuloplasty and trivial mitral regurgitation.   Ischemic testing at that time was low risk showing a mild inferoapical ischemic territory that has been managed medically in the absence of angina.  Plan to update echocardiogram.  Otherwise continue observation on medical therapy including Plavix, Toprol-XL, Crestor, and as needed nitroglycerin.   2.  Primary hypertension.  Blood pressure is well-controlled today.  Continue HCTZ.   3.  Mixed hyperlipidemia.  LDL 66 in September.  Continue Crestor.  Disposition:  Follow up  6 months.  Signed, Jonelle Sidle, M.D., F.A.C.C. [facility] at [facility]

## 2024-01-14 ENCOUNTER — Ambulatory Visit: Payer: Medicare Other | Attending: Cardiology

## 2024-01-14 DIAGNOSIS — I059 Rheumatic mitral valve disease, unspecified: Secondary | ICD-10-CM | POA: Insufficient documentation

## 2024-01-14 MED ORDER — PERFLUTREN LIPID MICROSPHERE
1.0000 mL | INTRAVENOUS | Status: AC | PRN
  Administered 2024-01-14: 2.5 mL via INTRAVENOUS

## 2024-01-15 LAB — ECHOCARDIOGRAM COMPLETE
AR max vel: 1.85 cm2
AV Area VTI: 1.8 cm2
AV Area mean vel: 1.87 cm2
AV Mean grad: 10 mm[Hg]
AV Peak grad: 17.1 mm[Hg]
Ao pk vel: 2.07 m/s
Area-P 1/2: 1.65 cm2
Calc EF: 57.3 %
MV VTI: 1.06 cm2
S' Lateral: 3.8 cm
Single Plane A2C EF: 58 %
Single Plane A4C EF: 56 %

## 2024-07-06 ENCOUNTER — Encounter: Payer: Self-pay | Admitting: Cardiology

## 2024-07-06 ENCOUNTER — Ambulatory Visit: Attending: Cardiology | Admitting: Cardiology

## 2024-07-06 VITALS — BP 122/64 | HR 62 | Ht 71.0 in | Wt 213.4 lb

## 2024-07-06 DIAGNOSIS — I25119 Atherosclerotic heart disease of native coronary artery with unspecified angina pectoris: Secondary | ICD-10-CM | POA: Diagnosis not present

## 2024-07-06 DIAGNOSIS — I10 Essential (primary) hypertension: Secondary | ICD-10-CM | POA: Diagnosis not present

## 2024-07-06 DIAGNOSIS — E782 Mixed hyperlipidemia: Secondary | ICD-10-CM

## 2024-07-06 DIAGNOSIS — Z9889 Other specified postprocedural states: Secondary | ICD-10-CM | POA: Diagnosis not present

## 2024-07-06 NOTE — Patient Instructions (Signed)

## 2024-07-06 NOTE — Progress Notes (Signed)
 Cardiology Office Note  Date: 07/06/2024   ID: Garyn, Arlotta [birth year], MRN [id number]  History of Present Illness: Matthew Williams is a 78 y.o. male last seen in December 2024.  He is here for a routine visit.  Reports no angina or interval nitroglycerin  use, stable NYHA class II dyspnea with typical activities.  He has had no palpitations or syncope.  No cough.  We went over his medications.  He reports compliance with therapy, no bleeding problems on Plavix .  Lab work from September of last year showed LDL 66 on Crestor .  We discussed his interval echocardiogram which is noted below.  I reviewed his ECG today which shows sinus rhythm with right bundle branch block, possible old inferior infarct pattern, PVC.  Physical Exam: VS:  BP 122/64   Pulse 62   Ht 5' 11 (1.803 m)   Wt 213 lb 6.4 oz (96.8 kg)   SpO2 96%   BMI 29.76 kg/m , BMI Body mass index is 29.76 kg/m.  Wt Readings from Last 3 Encounters:  07/06/24 213 lb 6.4 oz (96.8 kg)  12/06/23 220 lb 6.4 oz (100 kg)  05/28/23 217 lb 9.6 oz (98.7 kg)    General: Patient appears comfortable at rest. HEENT: Conjunctiva and lids normal. Neck: Supple, no elevated JVP or carotid bruits. Lungs: Coarse breath sounds, no wheezing or crackles, nonlabored breathing at rest. Cardiac: Regular rate and rhythm, no S3, 2/6 systolic murmur. Extremities: Mild ankle edema.  ECG:  An ECG dated 05/28/2023 was personally reviewed today and demonstrated:  Sinus rhythm with right bundle branch block.  Labwork:  September 2024: TSH 2.04, hemoglobin 14.8, platelets 185, cholesterol 126, triglycerides 146, HDL 34, LDL 66, BUN 11, creatinine 0.97, potassium 3.7, AST 24, ALT 22, GFR 80  Other Studies Reviewed Today:  Echocardiogram 01/14/2024:  1. No evidence of LV thrombus. Definity  administered. Left ventricular  ejection fraction, by estimation, is 55 to 60%. The left ventricle has  normal function. The left ventricle has no  regional wall motion  abnormalities. Left ventricular diastolic  parameters are consistent with Grade II diastolic dysfunction  (pseudonormalization). Elevated left atrial pressure.   2. Right ventricular systolic function is low normal. The right  ventricular size is normal. There is mildly elevated pulmonary artery  systolic pressure. The estimated right ventricular systolic pressure is  41.9 mmHg.   3. The mitral valve has been repaired/replaced. Trivial mitral valve  regurgitation. Moderate to severe mitral stenosis. The mean mitral valve  gradient is 10.0 mmHg. There is a prosthetic annuloplasty ring present in  the mitral position.   4. The tricuspid valve is abnormal.   5. The aortic valve is tricuspid. There is moderate calcification of the  aortic valve. Aortic valve regurgitation is mild. Mild aortic valve  stenosis. Aortic valve mean gradient measures 10.0 mmHg.   6. Aortic dilatation noted. There is borderline dilatation of the aortic  root, measuring 39 mm. There is borderline dilatation of the ascending  aorta, measuring 38 mm.   7. The inferior vena cava is normal in size with greater than 50%  respiratory variability, suggesting right atrial pressure of 3 mmHg.   Assessment and Plan:  1.  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  Ischemic testing in 2021 was low risk showing a mild inferoapical ischemic territory that has been managed medically in the absence of angina.  Follow-up echocardiogram demonstrates LVEF 55 to 60%.  Mitral annuloplasty noted with relatively  stable mean MV gradient of 10 mmHg and trivial mitral regurgitation.  He remains symptomatically stable without active angina or interval nitroglycerin  use, NYHA class II dyspnea.  ECG reviewed.  Continue Plavix  75 mg daily, Crestor  40 mg daily, and as needed nitroglycerin .   2.  Primary hypertension.  Blood pressure is well-controlled.  Continue HCTZ 25 mg daily and Toprol -XL 50 mg  daily.   3.  Mixed hyperlipidemia.  LDL 66 in September 2024.  Continue Crestor  40 mg daily.  4.  Mild calcific aortic stenosis with mean AV gradient of 10 mmHg.  Asymptomatic.  Disposition:  Follow up 6 months.  Signed, Jayson JUDITHANN Sierras, M.D., F.A.C.C. [facility]

## 2025-01-21 ENCOUNTER — Encounter: Payer: Self-pay | Admitting: Nurse Practitioner

## 2025-01-21 ENCOUNTER — Ambulatory Visit: Admitting: Nurse Practitioner

## 2025-01-21 VITALS — BP 122/70 | HR 60 | Ht 71.0 in | Wt 214.8 lb

## 2025-01-21 DIAGNOSIS — I350 Nonrheumatic aortic (valve) stenosis: Secondary | ICD-10-CM

## 2025-01-21 MED ORDER — NITROGLYCERIN 0.4 MG SL SUBL: 0.4000 mg | SUBLINGUAL_TABLET | SUBLINGUAL | 3 refills | Status: AC

## 2025-01-21 NOTE — Progress Notes (Unsigned)
" °  Cardiology Office Note   Date:  01/21/2025  ID:  Aayush, Gelpi Sep 14, 1946, MRN [id number] PCP: Nena Cyndee DELENA DEVONNA  [facility] Providers Cardiologist:  Jayson Sierras, MD { Click to update primary MD,subspecialty MD or APP then REFRESH:1}    History of Present Illness EDREI NORGAARD is a 79 y.o. male with a PMH of CAD, s/p CABG in 1996 with mitral annuloplasty, mixed HLD, HTN, mild aortic valve stenosis, who presents today for 6 month follow-up.   Last seen by Dr. Sierras on 07/06/2024. Was doing well at the time.   He is here for 6 month follow-up. He states...   ROS: ***  Studies Reviewed      *** Risk Assessment/Calculations {Does this patient have ATRIAL FIBRILLATION?:[phone]}         Physical Exam VS:  BP 122/70   Pulse 60   Ht 5' 11 (1.803 m)   Wt 214 lb 12.8 oz (97.4 kg)   SpO2 96%   BMI 29.96 kg/m        Wt Readings from Last 3 Encounters:  01/21/25 214 lb 12.8 oz (97.4 kg)  07/06/24 213 lb 6.4 oz (96.8 kg)  12/06/23 220 lb 6.4 oz (100 kg)    GEN: Well nourished, well developed in no acute distress NECK: No JVD; No carotid bruits CARDIAC: ***RRR, no murmurs, rubs, gallops RESPIRATORY:  Clear to auscultation without rales, wheezing or rhonchi  ABDOMEN: Soft, non-tender, non-distended EXTREMITIES:  No edema; No deformity   ASSESSMENT AND PLAN ***    {Are you ordering a CV Procedure (e.g. stress test, cath, DCCV, TEE, etc)?   Press F2        :[id number]}  Dispo: ***  Signed, Almarie Crate, NP   "

## 2025-01-21 NOTE — Patient Instructions (Signed)
 Medication Instructions:   Nitroglycerin  refilled today  Continue all other medications.     Labwork:  none  Testing/Procedures:  Your physician has requested that you have an echocardiogram. Echocardiography is a painless test that uses sound waves to create images of your heart. It provides your doctor with information about the size and shape of your heart and how well your hearts chambers and valves are working. This procedure takes approximately one hour. There are no restrictions for this procedure. Please do NOT wear cologne, perfume, aftershave, or lotions (deodorant is allowed). Please arrive 15 minutes prior to your appointment time.  Please note: We ask at that you not bring children with you during ultrasound (echo/ vascular) testing. Due to room size and safety concerns, children are not allowed in the ultrasound rooms during exams. Our front office staff cannot provide observation of children in our lobby area while testing is being conducted. An adult accompanying a patient to their appointment will only be allowed in the ultrasound room at the discretion of the ultrasound technician under special circumstances. We apologize for any inconvenience. Office will contact with results via phone, letter or mychart.     Follow-Up:  Your physician wants you to follow up in:  1 year.  You should receive a recall letter in the mail about 2 months prior to the time you are due.  If you don't receive this, please call our office to schedule your follow up appointment.      Any Other Special Instructions Will Be Listed Below (If Applicable).   If you need a refill on your cardiac medications before your next appointment, please call your pharmacy.

## 2025-01-25 ENCOUNTER — Ambulatory Visit
